# Patient Record
Sex: Male | Born: 1985 | Race: White | Hispanic: No | Marital: Married | Smoking: Current every day smoker
Health system: Southern US, Community
[De-identification: ages and names within clinical notes are randomized; demographics above are authoritative.]

## PROBLEM LIST (undated history)

## (undated) DIAGNOSIS — F419 Anxiety disorder, unspecified: Secondary | ICD-10-CM

## (undated) DIAGNOSIS — F909 Attention-deficit hyperactivity disorder, unspecified type: Secondary | ICD-10-CM

## (undated) DIAGNOSIS — I4510 Unspecified right bundle-branch block: Secondary | ICD-10-CM

## (undated) DIAGNOSIS — F32A Depression, unspecified: Secondary | ICD-10-CM

## (undated) HISTORY — PX: APPENDECTOMY: SHX54

---

## 2013-10-10 ENCOUNTER — Emergency Department (HOSPITAL_COMMUNITY)
Admission: EM | Admit: 2013-10-10 | Discharge: 2013-10-10 | Disposition: A | Discharge status: Home / Self Care | Payer: 59 | Source: Home / Self Care | Attending: Emergency Medicine | Admitting: Emergency Medicine

## 2013-10-10 ENCOUNTER — Encounter (HOSPITAL_COMMUNITY): Payer: Self-pay | Admitting: Emergency Medicine

## 2013-10-10 DIAGNOSIS — J069 Acute upper respiratory infection, unspecified: Secondary | ICD-10-CM

## 2013-10-10 HISTORY — DX: Unspecified right bundle-branch block: I45.10

## 2013-10-10 LAB — POCT URINALYSIS DIP (DEVICE)
Bilirubin Urine: NEGATIVE
GLUCOSE, UA: NEGATIVE mg/dL
Hgb urine dipstick: NEGATIVE
Ketones, ur: NEGATIVE mg/dL
Leukocytes, UA: NEGATIVE
NITRITE: NEGATIVE
Protein, ur: NEGATIVE mg/dL
Specific Gravity, Urine: 1.015 (ref 1.005–1.030)
UROBILINOGEN UA: 0.2 mg/dL (ref 0.0–1.0)
pH: 7 (ref 5.0–8.0)

## 2013-10-10 NOTE — Discharge Instructions (Signed)
Upper Respiratory Infection, Adult An upper respiratory infection (URI) is also sometimes known as the common cold. The upper respiratory tract includes the nose, sinuses, throat, trachea, and bronchi. Bronchi are the airways leading to the lungs. Most people improve within 1 week, but symptoms can last up to 2 weeks. A residual cough may last even longer.  CAUSES Many different viruses can infect the tissues lining the upper respiratory tract. The tissues become irritated and inflamed and often become very moist. Mucus production is also common. A cold is contagious. You can easily spread the virus to others by oral contact. This includes kissing, sharing a glass, coughing, or sneezing. Touching your mouth or nose and then touching a surface, which is then touched by another person, can also spread the virus. SYMPTOMS  Symptoms typically develop 1 to 3 days after you come in contact with a cold virus. Symptoms vary from person to person. They may include:  Runny nose.  Sneezing.  Nasal congestion.  Sinus irritation.  Sore throat.  Loss of voice (laryngitis).  Cough.  Fatigue.  Muscle aches.  Loss of appetite.  Headache.  Low-grade fever. DIAGNOSIS  You might diagnose your own cold based on familiar symptoms, since most people get a cold 2 to 3 times a year. Your caregiver can confirm this based on your exam. Most importantly, your caregiver can check that your symptoms are not due to another disease such as strep throat, sinusitis, pneumonia, asthma, or epiglottitis. Blood tests, throat tests, and X-rays are not necessary to diagnose a common cold, but they may sometimes be helpful in excluding other more serious diseases. Your caregiver will decide if any further tests are required. RISKS AND COMPLICATIONS  You may be at risk for a more severe case of the common cold if you smoke cigarettes, have chronic heart disease (such as heart failure) or lung disease (such as asthma), or if  you have a weakened immune system. The very young and very old are also at risk for more serious infections. Bacterial sinusitis, middle ear infections, and bacterial pneumonia can complicate the common cold. The common cold can worsen asthma and chronic obstructive pulmonary disease (COPD). Sometimes, these complications can require emergency medical care and may be life-threatening. PREVENTION  The best way to protect against getting a cold is to practice good hygiene. Avoid oral or hand contact with people with cold symptoms. Wash your hands often if contact occurs. There is no clear evidence that vitamin C, vitamin E, echinacea, or exercise reduces the chance of developing a cold. However, it is always recommended to get plenty of rest and practice good nutrition. TREATMENT  Treatment is directed at relieving symptoms. There is no cure. Antibiotics are not effective, because the infection is caused by a virus, not by bacteria. Treatment may include:  Increased fluid intake. Sports drinks offer valuable electrolytes, sugars, and fluids.  Breathing heated mist or steam (vaporizer or shower).  Eating chicken soup or other clear broths, and maintaining good nutrition.  Getting plenty of rest.  Using gargles or lozenges for comfort.  Controlling fevers with ibuprofen or acetaminophen as directed by your caregiver.  Increasing usage of your inhaler if you have asthma. Zinc gel and zinc lozenges, taken in the first 24 hours of the common cold, can shorten the duration and lessen the severity of symptoms. Pain medicines may help with fever, muscle aches, and throat pain. A variety of non-prescription medicines are available to treat congestion and runny nose. Your caregiver   can make recommendations and may suggest nasal or lung inhalers for other symptoms.  HOME CARE INSTRUCTIONS   Only take over-the-counter or prescription medicines for pain, discomfort, or fever as directed by your  caregiver.  Use a warm mist humidifier or inhale steam from a shower to increase air moisture. This may keep secretions moist and make it easier to breathe.  Drink enough water and fluids to keep your urine clear or pale yellow.  Rest as needed.  Return to work when your temperature has returned to normal or as your caregiver advises. You may need to stay home longer to avoid infecting others. You can also use a face mask and careful hand washing to prevent spread of the virus. SEEK MEDICAL CARE IF:   After the first few days, you feel you are getting worse rather than better.  You need your caregiver's advice about medicines to control symptoms.  You develop chills, worsening shortness of breath, or brown or red sputum. These may be signs of pneumonia.  You develop yellow or brown nasal discharge or pain in the face, especially when you bend forward. These may be signs of sinusitis.  You develop a fever, swollen neck glands, pain with swallowing, or white areas in the back of your throat. These may be signs of strep throat. SEEK IMMEDIATE MEDICAL CARE IF:   You have a fever.  You develop severe or persistent headache, ear pain, sinus pain, or chest pain.  You develop wheezing, a prolonged cough, cough up blood, or have a change in your usual mucus (if you have chronic lung disease).  You develop sore muscles or a stiff neck. Document Released: 10/01/2000 Document Revised: 06/30/2011 Document Reviewed: 08/09/2010 [employer] Patient Information 2015 [employer]. This information is not intended to replace advice given to you by your health care provider. Make sure you discuss any questions you have with your health care provider.  

## 2013-10-10 NOTE — ED Notes (Signed)
C/o sick since last PM. He and wife are expecting first child in ~1 week

## 2013-10-10 NOTE — ED Provider Notes (Signed)
CSN: [id number]     Arrival date & time 10/10/13  1717 History   First MD Initiated Contact with Patient 10/10/13 1852     Chief Complaint  Patient presents with  . URI   (Consider location/radiation/quality/duration/timing/severity/associated sxs/prior Treatment) HPI Comments: 28 year old male presents complaining of postnasal drainage, sore throat, and body aches. This began last night. Symptoms have been constant. He has not tried taking any over-the-counter medications. He says that he would not normally come to the doctor for something like this but his wife is about to give birth to their first child so he wanted to check to see if this happened to be a bacterial infection so that he can go ahead and be treated. He denies sinus pain or pressure, cough, shortness of breath, fever. No recent travel or sick contacts.   Past Medical History  Diagnosis Date  . RBBB   . Coronary artery disease    Past Surgical History  Procedure Laterality Date  . Appendectomy     History reviewed. No pertinent family history. History  Substance Use Topics  . Smoking status: Never Smoker   . Smokeless tobacco: Not on file  . Alcohol Use: Yes    Review of Systems  Constitutional: Negative for fever and chills.  HENT: Positive for congestion, postnasal drip and sore throat.   Respiratory: Negative for cough and shortness of breath.   Cardiovascular: Negative for chest pain.  Musculoskeletal: Positive for myalgias.  All other systems reviewed and are negative.   Allergies  Review of patient's allergies indicates no known allergies.  Home Medications   Prior to Admission medications   Not on File   BP 129/90  Pulse 69  Temp(Src) 98 F (36.7 C) (Oral)  Resp 14  SpO2 98% Physical Exam  Nursing note and vitals reviewed. Constitutional: He is oriented to person, place, and time. He appears well-developed and well-nourished. No distress.  HENT:  Head: Normocephalic and atraumatic.   Right Ear: External ear normal.  Left Ear: External ear normal.  Nose: Nose normal.  Mouth/Throat: Uvula is midline and mucous membranes are normal. Posterior oropharyngeal erythema (mild) present. No oropharyngeal exudate.  Eyes: Conjunctivae are normal.  Neck: Normal range of motion. Neck supple.  Cardiovascular: Normal rate, regular rhythm and normal heart sounds.  Exam reveals no gallop and no friction rub.   No murmur heard. Pulmonary/Chest: Effort normal and breath sounds normal. No respiratory distress. He has no wheezes. He has no rales.  Abdominal: He exhibits no mass. There is no tenderness. There is no rebound and no guarding.  Lymphadenopathy:    He has no cervical adenopathy.  Neurological: He is alert and oriented to person, place, and time. Coordination normal.  Skin: Skin is warm and dry. No rash noted. He is not diaphoretic.  Psychiatric: He has a normal mood and affect. Judgment normal.    ED Course  Procedures (including critical care time) Labs Review Labs Reviewed  CULTURE, GROUP A STREP  POCT URINALYSIS DIP (DEVICE)    Imaging Review No results found.   MDM   1. URI (upper respiratory infection)    Most likely a viral infection. Symptomatic treatment with over-the-counter medications, followup in a few days if no improvement or worsening     Graylon GoodZachary H Baker, PA-C 10/11/13 1747

## 2013-10-12 LAB — CULTURE, GROUP A STREP

## 2013-10-12 NOTE — ED Provider Notes (Signed)
Medical screening examination/treatment/procedure(s) were performed by non-physician practitioner and as supervising physician I was immediately available for consultation/collaboration.  Leslee Homeavid Keller, M.D.  Reuben Likesavid C Keller, MD 10/12/13 [phone]

## 2014-04-24 ENCOUNTER — Ambulatory Visit (INDEPENDENT_AMBULATORY_CARE_PROVIDER_SITE_OTHER): Payer: 59 | Admitting: Physician Assistant

## 2014-04-24 VITALS — BP 118/72 | HR 84 | Temp 97.9°F | Resp 18 | Ht 68.5 in | Wt 159.0 lb

## 2014-04-24 DIAGNOSIS — J069 Acute upper respiratory infection, unspecified: Secondary | ICD-10-CM

## 2014-04-24 DIAGNOSIS — B9789 Other viral agents as the cause of diseases classified elsewhere: Principal | ICD-10-CM

## 2014-04-24 MED ORDER — LORATADINE-PSEUDOEPHEDRINE ER 5-120 MG PO TB12: 1.0000 | ORAL_TABLET | ORAL | Status: AC

## 2014-04-24 MED ORDER — IBUPROFEN 600 MG PO TABS: 600.0000 mg | ORAL_TABLET | Freq: Three times a day (TID) | ORAL | Status: AC | PRN

## 2014-04-24 MED ORDER — OXYMETAZOLINE HCL 0.05 % NA SOLN: 1.0000 | Freq: Two times a day (BID) | NASAL | Status: DC

## 2014-04-24 NOTE — Progress Notes (Signed)
    IDENTIFYING INFORMATION  Ivan Ortiz / DOB: June 10, 1985 / MRN: [id number]  The patient  does not have a problem list on file.  SUBJECTIVE  CC: Nasal Congestion; Cough; Sinusitis; and Generalized Body Aches   HPI: Ivan Ortiz is a 29 y.o. y.o. male presenting with sinus pressure that started on 04/20/2014.  He reports that he developed global sinus congestion that was severe on Saturday along with mild ear pain that comes and goes, malaise, fatigue and some muscle aches.  As of today he has developed a non productive cough and a mild sore throat.  He denies any eye symptoms.  He has a small child at home and was concerned that his symptoms could represent the flu.  He has had the flu vaccination and denies severe cough and fever. He has not tried anything for his symptoms.     He  has a past medical history of RBBB.    He take no medication.   Ivan Ortiz has No Known Allergies. He  reports that he has never smoked. He does not have any smokeless tobacco history on file. He reports that he drinks alcohol. He  reports that he currently engages in sexual activity.  The patient  has past surgical history that includes Appendectomy.  His family history is not on file.  Review of Systems  Constitutional: Positive for malaise/fatigue. Negative for fever, chills and diaphoresis.  HENT: Positive for congestion and sore throat.   Respiratory: Positive for cough.   Cardiovascular: Negative for chest pain and palpitations.  Skin: Negative.   Neurological: Negative for headaches.    OBJECTIVE  Blood pressure 118/72, pulse 84, temperature 97.9 F (36.6 C), temperature source Oral, resp. rate 18, height 5' 8.5" (1.74 m), weight 159 lb (72.122 kg), SpO2 98 %. The patient's body mass index is 23.82 kg/(m^2).  Physical Exam  Vitals reviewed. Constitutional: He is oriented to person, place, and time. He appears well-developed and well-nourished.  HENT:  Right Ear: External ear normal.  Left  Ear: External ear normal.  Nose: Nose normal.  Mouth/Throat: Oropharynx is clear and moist. No oropharyngeal exudate.  Eyes: Conjunctivae are normal. Right eye exhibits no discharge. Left eye exhibits no discharge.  Cardiovascular: Normal rate, regular rhythm, normal heart sounds and intact distal pulses.   No murmur heard. Respiratory: Effort normal and breath sounds normal.  Musculoskeletal: Normal range of motion.  Neurological: He is alert and oriented to person, place, and time.  Skin: Skin is warm and dry.  Psychiatric: He has a normal mood and affect. His behavior is normal. Judgment and thought content normal.    No results found for this or any previous visit (from the past 24 hour(s)).  ASSESSMENT & PLAN  Ivan Ortiz was seen today for nasal congestion, cough, sinusitis and generalized body aches.  Diagnoses and associated orders for this visit:  Viral URI with cough - loratadine-pseudoephedrine (CLARITIN-D 12 HOUR) 5-120 MG per tablet; Take 1 tablet by mouth every morning. - oxymetazoline (AFRIN NASAL SPRAY) 0.05 % nasal spray; Place 1 spray into both nostrils 2 (two) times daily. - ibuprofen (ADVIL,MOTRIN) 600 MG tablet; Take 1 tablet (600 mg total) by mouth every 8 (eight) hours as needed.     The patient was instructed to to call or comeback to clinic as needed, or should symptoms warrant.  Deliah Boston, MHS, PA-C [facility] 04/24/2014 3:18 PM

## 2015-10-26 ENCOUNTER — Encounter: Payer: Self-pay | Admitting: Physician Assistant

## 2015-10-26 ENCOUNTER — Ambulatory Visit (INDEPENDENT_AMBULATORY_CARE_PROVIDER_SITE_OTHER): Payer: 59 | Admitting: Physician Assistant

## 2015-10-26 ENCOUNTER — Ambulatory Visit (INDEPENDENT_AMBULATORY_CARE_PROVIDER_SITE_OTHER): Payer: 59

## 2015-10-26 VITALS — BP 128/80 | HR 78 | Temp 98.7°F | Resp 16 | Ht 67.5 in | Wt 156.8 lb

## 2015-10-26 DIAGNOSIS — R198 Other specified symptoms and signs involving the digestive system and abdomen: Secondary | ICD-10-CM

## 2015-10-26 DIAGNOSIS — R053 Chronic cough: Secondary | ICD-10-CM

## 2015-10-26 DIAGNOSIS — R05 Cough: Secondary | ICD-10-CM | POA: Diagnosis not present

## 2015-10-26 DIAGNOSIS — F458 Other somatoform disorders: Secondary | ICD-10-CM

## 2015-10-26 DIAGNOSIS — R0989 Other specified symptoms and signs involving the circulatory and respiratory systems: Secondary | ICD-10-CM

## 2015-10-26 DIAGNOSIS — F17210 Nicotine dependence, cigarettes, uncomplicated: Secondary | ICD-10-CM | POA: Diagnosis not present

## 2015-10-26 LAB — POCT CBC
GRANULOCYTE PERCENT: 64.7 % (ref 37–80)
HEMATOCRIT: 49.3 % (ref 43.5–53.7)
Hemoglobin: 17.4 g/dL (ref 14.1–18.1)
Lymph, poc: 1.5 (ref 0.6–3.4)
MCH, POC: 31.9 pg — AB (ref 27–31.2)
MCHC: 35.4 g/dL (ref 31.8–35.4)
MCV: 90.1 fL (ref 80–97)
MID (cbc): 0.5 (ref 0–0.9)
MPV: 7.4 fL (ref 0–99.8)
POC GRANULOCYTE: 3.7 (ref 2–6.9)
POC LYMPH %: 26.4 % (ref 10–50)
POC MID %: 8.9 %M (ref 0–12)
Platelet Count, POC: 183 10*3/uL (ref 142–424)
RBC: 5.46 M/uL (ref 4.69–6.13)
RDW, POC: 12.9 %
WBC: 5.7 10*3/uL (ref 4.6–10.2)

## 2015-10-26 MED ORDER — VARENICLINE TARTRATE 1 MG PO TABS: 1.0000 mg | ORAL_TABLET | Freq: Two times a day (BID) | ORAL | Status: DC

## 2015-10-26 MED ORDER — VARENICLINE TARTRATE 0.5 MG X 11 & 1 MG X 42 PO MISC: ORAL | Status: DC

## 2015-10-26 NOTE — Patient Instructions (Signed)
     IF you received an x-ray today, you will receive an invoice from [facility]. Please contact [facility] at [phone] with questions or concerns regarding your invoice.   IF you received labwork today, you will receive an invoice from [facility]. Please contact [facility] at [phone] with questions or concerns regarding your invoice.   Our billing staff will not be able to assist you with questions regarding bills from these companies.  You will be contacted with the lab results as soon as they are available. The fastest way to get your results is to activate your My Chart account. Instructions are located on the last page of this paperwork. If you have not heard from us regarding the results in 2 weeks, please contact this office.      

## 2015-10-26 NOTE — Progress Notes (Signed)
10/26/2015 10:26 AM   DOB: 1985-10-21 / MRN: [id number]  SUBJECTIVE:  Ivan Ortiz is a 30 y.o. male presenting chrornic cough.  Ivan Ortiz has a 15 pack year history of smoking and reports Ivan Ortiz has a hacking cough that has been worsening.  The cough is worse in the morning.  Ivan Ortiz also complains of "throat swelling" and viscous drainage down his throat and worries about his smoking history with regard to this complaint. Ivan Ortiz has two small children at home and a third on the way.  Reports Ivan Ortiz worries about his smoking reducing his mortality and does not want to leave his wife and kids.    Dep[facility] screen [facility] 10/26/2015 10/26/2015  Decreased Interest 0 0  Down, Depressed, Hopeless 0 0  PHQ - 2 Score 0 0   Ivan Ortiz has No Known Allergies.   Ivan Ortiz  has a past medical history of RBBB.    Ivan Ortiz  reports that Ivan Ortiz has never smoked. Ivan Ortiz does not have any smokeless tobacco history on file. Ivan Ortiz reports that Ivan Ortiz drinks alcohol. Ivan Ortiz  reports that Ivan Ortiz currently engages in sexual activity. The patient  has past surgical history that includes Appendectomy.  His family history is not on file.  Review of Systems  Constitutional: Negative for fever, chills, weight loss and diaphoresis.  Musculoskeletal: Negative for myalgias.  Skin: Negative for rash.  Neurological: Negative for dizziness and headaches.    Problem list and medications reviewed and updated by myself where necessary, and exist elsewhere in the encounter.   OBJECTIVE:  BP 128/80 mmHg  Pulse 78  Temp(Src) 98.7 F (37.1 C) (Oral)  Resp 16  Ht 5' 7.5" (1.715 m)  Wt 156 lb 12.8 oz (71.124 kg)  BMI 24.18 kg/m2  SpO2 100%  Physical Exam  Constitutional: Ivan Ortiz is oriented to person, place, and time. Ivan Ortiz appears well-developed. Ivan Ortiz does not appear ill.  HENT:  Mouth/Throat: Uvula is midline, oropharynx is clear and moist and mucous membranes are normal.  Eyes: Conjunctivae and EOM are normal. Pupils are equal, round, and reactive to light.  Cardiovascular: Normal  rate and regular rhythm.   Pulmonary/Chest: Effort normal and breath sounds normal. Ivan Ortiz has no decreased breath sounds. Ivan Ortiz has no wheezes. Ivan Ortiz has no rhonchi. Ivan Ortiz has no rales.  Abdominal: Ivan Ortiz exhibits no distension.  Musculoskeletal: Normal range of motion.  Neurological: Ivan Ortiz is alert and oriented to person, place, and time. No cranial nerve deficit. Coordination normal.  Skin: Skin is warm and dry. Ivan Ortiz is not diaphoretic.  Psychiatric: Ivan Ortiz has a normal mood and affect.  Nursing note and vitals reviewed.   Dg Neck Soft Tissue  10/26/2015  CLINICAL DATA:  Globus sensation. EXAM: NECK SOFT TISSUES - 1+ VIEW FINDINGS: Soft tissues of the neck are unremarkable. Epiglottis appears normal. Cervical airway widely patent. No foreign body noted. No acute bony abnormality. IMPRESSION: Negative exam. Electronically Signed   By: Maisie Fushomas  Register   On: 10/26/2015 10:11   Dg Chest 2 View  10/26/2015  CLINICAL DATA:  30 year old male with 15 year smoking history. Globus sensation. EXAM: CHEST  2 VIEW COMPARISON:  Same date neck radiograph FINDINGS: Cardiomediastinal contours are normal. No pleural effusion or pneumothorax. The lungs are well inflated without focal airspace consolidation or pulmonary edema. IMPRESSION: No active cardiopulmonary disease. No findings on this chest radiograph to explain the patient's reported globus sensation. Electronically Signed   By: Deatra RobinsonKevin  Herman M.D.   On: 10/26/2015 10:19    ASSESSMENT AND PLAN  Ivan BootyJoshua was seen today for annual exam.  Diagnoses and all orders for this visit:  Globus sensation: I don't have an explanation for this symptom however I have advised that Ivan Ortiz quit smoking.  Ivan Ortiz has failed patches and gum in the past so I will prescribe chantix.  If the symptoms are present 1 month after Ivan Ortiz quits then will try ranitidine and zyrtec.   -     DG Neck Soft Tissue; Future  Chronic coughing -     DG Chest 2 View; Future -     POCT CBC  Smokes more than 1 pack a day with  greater than 15 pack year history -     varenicline (CHANTIX) 1 MG tablet; Take 1 tablet (1 mg total) by mouth 2 (two) times daily. Starting 11/26/15. -     varenicline (CHANTIX PAK) 0.5 MG X 11 & 1 MG X 42 tablet; Take one 0.5 mg tablet by mouth once daily for 3 days, then increase to one 0.5 mg tablet twice daily for 4 days, then increase to one 1 mg tablet twice daily.    The patient was advised to call or return to clinic if Ivan Ortiz does not see an improvement in symptoms, or to seek the care of the closest emergency department if Ivan Ortiz worsens with the above plan.   Deliah BostonMichael Reshaun Briseno, MHS, PA-C [facility] 10/26/2015 10:26 AM

## 2015-11-07 ENCOUNTER — Telehealth: Payer: Self-pay

## 2015-11-07 ENCOUNTER — Other Ambulatory Visit: Payer: Self-pay | Admitting: Physician Assistant

## 2015-11-07 MED ORDER — BUPROPION HCL ER (SR) 150 MG PO TB12: ORAL_TABLET | ORAL | Status: DC

## 2015-11-07 NOTE — Progress Notes (Signed)
Chantix not covered.  Starting Wellbutrin. Deliah BostonMichael Clark, MS, PA-C 12:44 PM, 11/07/2015

## 2015-11-07 NOTE — Telephone Encounter (Signed)
PA for Chantix was denied because pt has not tried/failed wellbutrin yet. Casimiro NeedleMichael, do you want to Rx wellbutrin for smoking cessation?

## 2015-11-07 NOTE — Telephone Encounter (Signed)
Called pt who was not interested (at first) in trying an anti-depressant (he was familiar with wellbutrin). I advised that it is also approved for smoking cessation. Pt will consider taking it and I advised 2 add'l RFs were sent in. Advised that if this does not help after reaching therapeutic level, we can try to do the PA for Chantix again and it may be approved at that point. Pt will call me if that happens.

## 2015-11-07 NOTE — Telephone Encounter (Signed)
I will send in for Wellbutrin.  Please call him back to let him know. Deliah BostonMichael Joya Willmott, MS, PA-C 12:41 PM, 11/07/2015

## 2016-02-13 ENCOUNTER — Emergency Department (HOSPITAL_COMMUNITY)
Admission: EM | Admit: 2016-02-13 | Discharge: 2016-02-13 | Disposition: A | Discharge status: Home / Self Care | Payer: 59 | Attending: Emergency Medicine | Admitting: Emergency Medicine

## 2016-02-13 ENCOUNTER — Encounter (HOSPITAL_COMMUNITY): Payer: Self-pay

## 2016-02-13 DIAGNOSIS — R200 Anesthesia of skin: Secondary | ICD-10-CM | POA: Diagnosis present

## 2016-02-13 DIAGNOSIS — F17200 Nicotine dependence, unspecified, uncomplicated: Secondary | ICD-10-CM | POA: Diagnosis not present

## 2016-02-13 DIAGNOSIS — R202 Paresthesia of skin: Secondary | ICD-10-CM | POA: Diagnosis not present

## 2016-02-13 NOTE — ED Provider Notes (Signed)
[facility]-EMERGENCY DEPT Provider Note   CSN: [id number] Arrival date & time: 02/13/16  1140     History   Chief Complaint Chief Complaint  Patient presents with  . Numbness    HPI Ivan Ortiz is a 30 y.o. male.  HPI Patient presents with numbness in his right face. States he woke this morning. States it felt as if his right ear canal was not also gone. No numbness weakness. States it may go down to the shoulder little bit. No drooling. No vision changes. States that he looked in the mirror for and thinks that his left pupil is larger than the right. States that someone looked in his eyes at work with a light.the reaction was not normal. No trauma. No episodes like this in the past.   Past Medical History:  Diagnosis Date  . RBBB     There are no active problems to display for this patient.   Past Surgical History:  Procedure Laterality Date  . APPENDECTOMY         Home Medications    Prior to Admission medications   Medication Sig Start Date End Date Taking? Authorizing Provider  buPROPion (WELLBUTRIN SR) 150 MG 12 hr tablet Take one tab daily for 3 days, then increase to one tab in the morning and one tab at night. Patient not taking: Reported on 02/13/2016 11/07/15   Ofilia Neas, PA-C    Family History History reviewed. No pertinent family history.  Social History Social History  Substance Use Topics  . Smoking status: Current Every Day Smoker    Packs/day: 0.50  . Smokeless tobacco: Never Used  . Alcohol use Yes     Comment: occ     Allergies   Review of patient's allergies indicates no known allergies.   Review of Systems Review of Systems  Constitutional: Negative for appetite change.  Eyes: Negative for pain.  Respiratory: Negative for chest tightness.   Cardiovascular: Negative for chest pain.  Gastrointestinal: Negative for abdominal pain.  Genitourinary: Negative for dysuria.  Musculoskeletal: Negative for back pain.  Neurological:  Positive for numbness. Negative for facial asymmetry and headaches.  Psychiatric/Behavioral: Negative for confusion.     Physical Exam Updated Vital Signs BP 131/89   Pulse 80   Temp 98.2 F (36.8 C) (Oral)   Resp 16   Ht 5\' 8"  (1.727 m)   Wt 155 lb (70.3 kg)   SpO2 99%   BMI 23.57 kg/m   Physical Exam  Constitutional: He is oriented to person, place, and time. He appears well-developed.  HENT:  Head: Atraumatic.  Eyes: EOM are normal.  Neck: Neck supple.  Cardiovascular: Normal rate.   Pulmonary/Chest: Effort normal.  Abdominal: Soft.  Musculoskeletal: Normal range of motion.  Neurological: He is alert and oriented to person, place, and time.  Paresthesia/decreased sensation to right side of face. Eye movements intact. Equal smile. Good eyebrow raise and lid closing bilaterally. Sensation intact grossly over shoulders. Good grips bilaterally. Normal gait. Pupils reactive and equal bilaterally.  Skin: Skin is warm. Capillary refill takes less than 2 seconds.     ED Treatments / Results  Labs (all labs ordered are listed, but only abnormal results are displayed) Labs Reviewed - No data to display  EKG  EKG Interpretation None       Radiology No results found.  Procedures Procedures (including critical care time)  Medications Ordered in ED Medications - No data to display   Initial Impression / Assessment and  Plan / ED Course  I have reviewed the triage vital signs and the nursing notes.  Pertinent labs & imaging results that were available during my care of the patient were reviewed by me and considered in my medical decision making (see chart for details).  Clinical Course    Patient with right-sided facial numbness. No pupil abnormality seen now. Discussed with patient about CT scan versus observation. Patient will follow-up with neurology as needed. CT scan likely low yield at this time. No other neuro deficits. Considered the fact that could be an  early shingles. Discharge home.  Final Clinical Impressions(s) / ED Diagnoses   Final diagnoses:  Paresthesia    New Prescriptions Discharge Medication List as of 02/13/2016 12:44 PM       Benjiman CoreNathan Kennedi Lizardo, MD 02/13/16 1301

## 2016-02-13 NOTE — ED Triage Notes (Signed)
Pt reports he woke up this morning with right side facial numbness. Pt reports the numbness has traveled into his face and into his right arm. Pt also reports he noticed his pupils were different sizes this morning.

## 2016-05-21 ENCOUNTER — Ambulatory Visit (INDEPENDENT_AMBULATORY_CARE_PROVIDER_SITE_OTHER): Payer: Self-pay | Admitting: Nurse Practitioner

## 2016-05-21 VITALS — BP 104/62 | HR 68 | Temp 98.1°F | Wt 160.8 lb

## 2016-05-21 DIAGNOSIS — R5381 Other malaise: Secondary | ICD-10-CM

## 2016-05-21 DIAGNOSIS — R52 Pain, unspecified: Secondary | ICD-10-CM

## 2016-05-21 DIAGNOSIS — R5383 Other fatigue: Secondary | ICD-10-CM

## 2016-05-21 DIAGNOSIS — J069 Acute upper respiratory infection, unspecified: Secondary | ICD-10-CM

## 2016-05-21 LAB — POCT INFLUENZA A/B
Influenza A, POC: NEGATIVE
Influenza B, POC: NEGATIVE

## 2016-05-21 NOTE — Progress Notes (Addendum)
Subjective:     Ivan Ortiz is a 31 y.o. male who presents for evaluation of symptoms of a URI. Symptoms include achiness, congestion, sneezing and muscle aches.  . Onset of symptoms was today and has been gradually worsening since that time. Treatment to date: none.  Patient is concerned that he may have influenza due to a recent patient contact in his work setting.  The following portions of the patient's history were reviewed and updated as appropriate: allergies, current medications and past medical history.  Review of Systems Constitutional: positive for malaise Eyes: negative Ears, nose, mouth, throat, and face: positive for nasal congestion Respiratory: negative Cardiovascular: negative Musculoskeletal:positive for neck, upper back and left shoulder aches.   Objective:    BP 104/62   Pulse 68   Temp 98.1 F (36.7 C)   Wt 160 lb 12.8 oz (72.9 kg)   SpO2 98%   BMI 24.45 kg/m  General appearance: alert and cooperative Head: Normocephalic, without obvious abnormality, atraumatic Eyes: conjunctivae/corneas clear. PERRL, EOM's intact. Fundi benign. Ears: normal TM's and external ear canals both ears Nose: clear discharge, mild maxillary sinus tenderness left Throat: lips, mucosa, and tongue normal; teeth and gums normal   Cardiovascular:  regular rate and rhythm, S1, S2 normal, no murmur, click, rub or gallop Respiratory:  Lungs clear to auscultation bilaterally Musculoskeletal:  No deformities, lesions or crepitus.  FROM to back, neck and left shoulder.    Assessment:    viral upper respiratory illness and Malaise   Plan:    Discussed diagnosis and treatment of URI. Suggested symptomatic OTC remedies. Nasal saline spray for congestion. Follow up as needed. Follow up in 2-3 days if no improvement. days or as needed.

## 2016-05-21 NOTE — Patient Instructions (Addendum)
Weakness Weakness is a lack of strength. It may be felt all over the body (generalized) or in one specific part of the body (focal). Some causes of weakness can be serious. You may need further medical evaluation, especially if you are elderly or you have a history of immunosuppression (such as chemotherapy or HIV), kidney disease, heart disease, or diabetes. CAUSES  Weakness can be caused by many different things, including:  Infection.  Physical exhaustion.  Internal bleeding or other blood loss that results in a lack of red blood cells (anemia).  Dehydration. This cause is more common in elderly people.  Side effects or electrolyte abnormalities from medicines, such as pain medicines or sedatives.  Emotional distress, anxiety, or depression.  Circulation problems, especially severe peripheral arterial disease.  Heart disease, such as rapid atrial fibrillation, bradycardia, or heart failure.  Nervous system disorders, such as Guillain-Barr syndrome, multiple sclerosis, or stroke. DIAGNOSIS  To find the cause of your weakness, your caregiver will take your history and perform a physical exam. Lab tests or X-rays may also be ordered, if needed. TREATMENT  Treatment of weakness depends on the cause of your symptoms and can vary greatly.  Use Tylenol or Ibuprofen for general discomfort, fever or pain.   HOME CARE INSTRUCTIONS   Rest as needed.  Eat a well-balanced diet.  Try to get some exercise every day.  Only take over-the-counter or prescription medicines as directed by your caregiver. SEEK MEDICAL CARE IF:   Your weakness seems to be getting worse or spreads to other parts of your body.  You develop new aches or pains. SEEK IMMEDIATE MEDICAL CARE IF:   You cannot perform your normal daily activities, such as getting dressed and feeding yourself.  You cannot walk up and down stairs, or you feel exhausted when you do so.  You have shortness of breath or chest  pain.  You have difficulty moving parts of your body.  You have weakness in only one area of the body or on only one side of the body.  You have a fever.  You have trouble speaking or swallowing.  You cannot control your bladder or bowel movements.  You have black or bloody vomit or stools. MAKE SURE YOU:  Understand these instructions.  Will watch your condition.  Will get help right away if you are not doing well or get worse. This information is not intended to replace advice given to you by your health care provider. Make sure you discuss any questions you have with your health care provider. Document Released: 04/07/2005 Document Revised: 10/07/2011 Document Reviewed: 01/26/2015 Elsevier Interactive Patient Education  2017 [employer]

## 2017-08-10 IMAGING — DX DG CHEST 2V
2 series · 2 of 2 positions shown · non-contrast
Comparison: Same date neck radiograph

CLINICAL DATA: 29-year-old male with 15 year smoking history.
Globus sensation.

EXAM:
CHEST  2 VIEW

[chest pa]
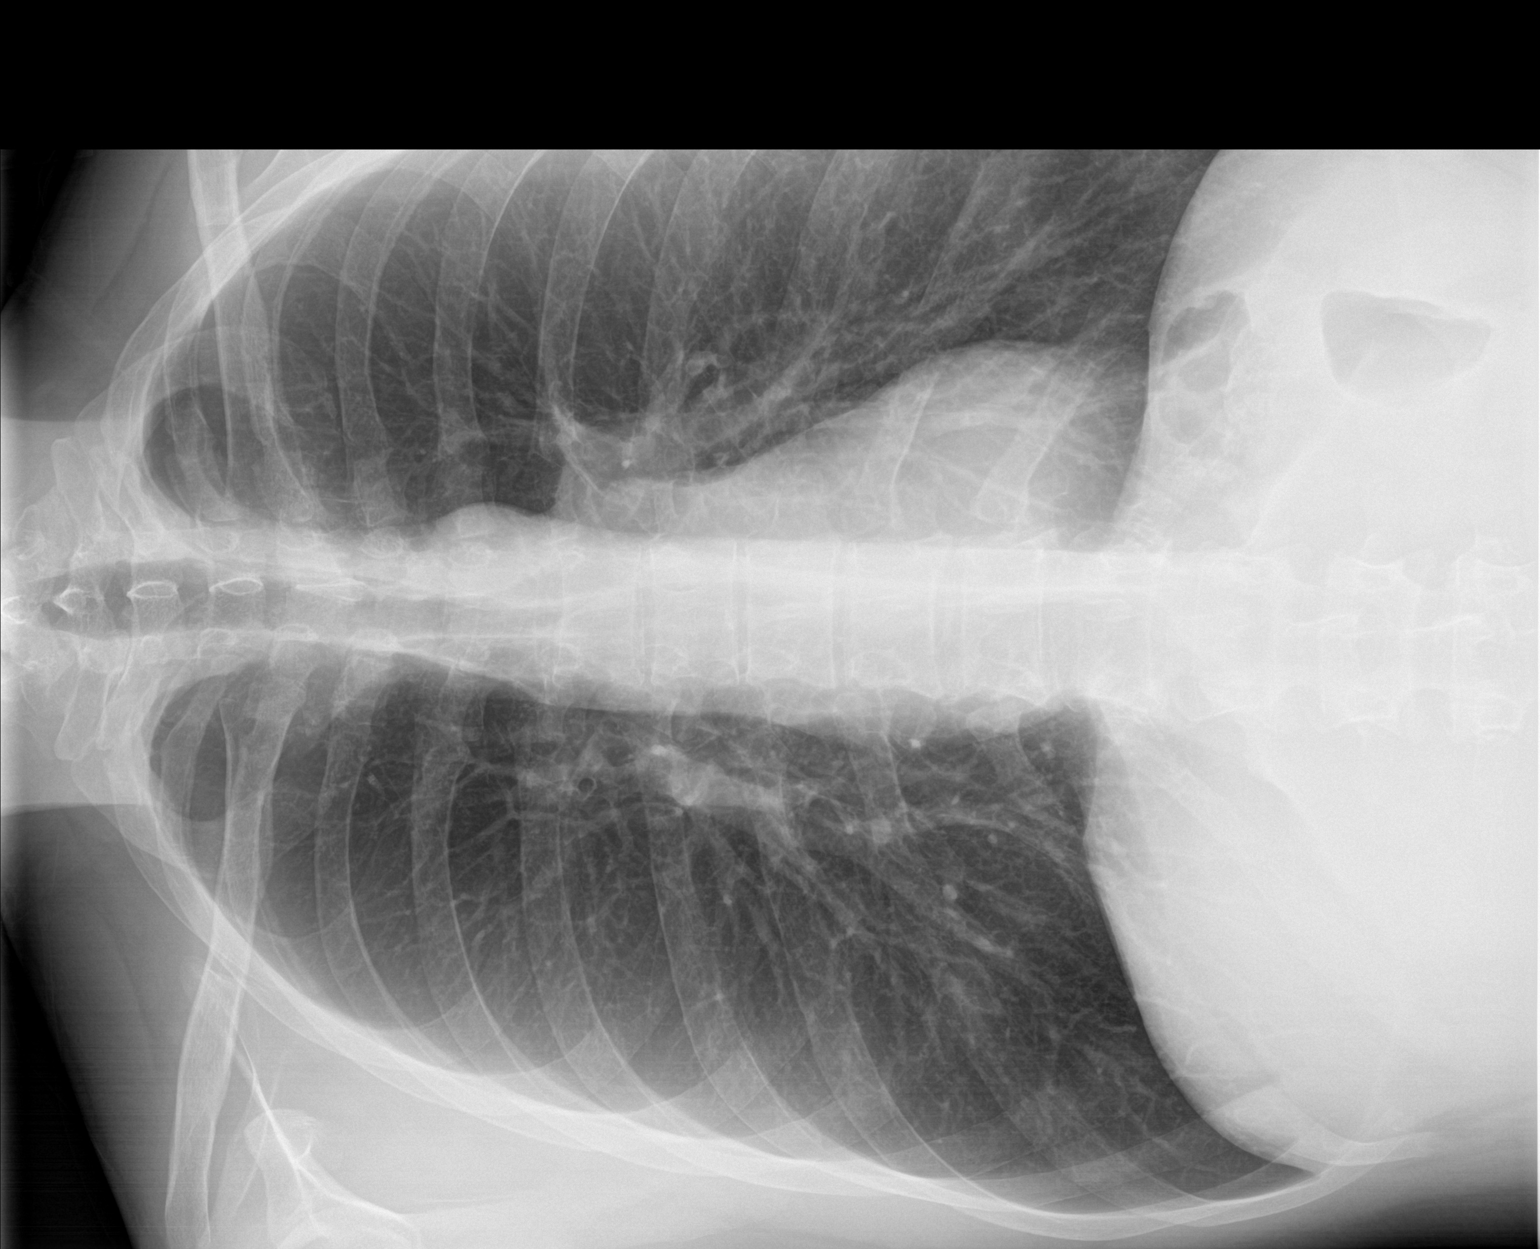

[chest lat]
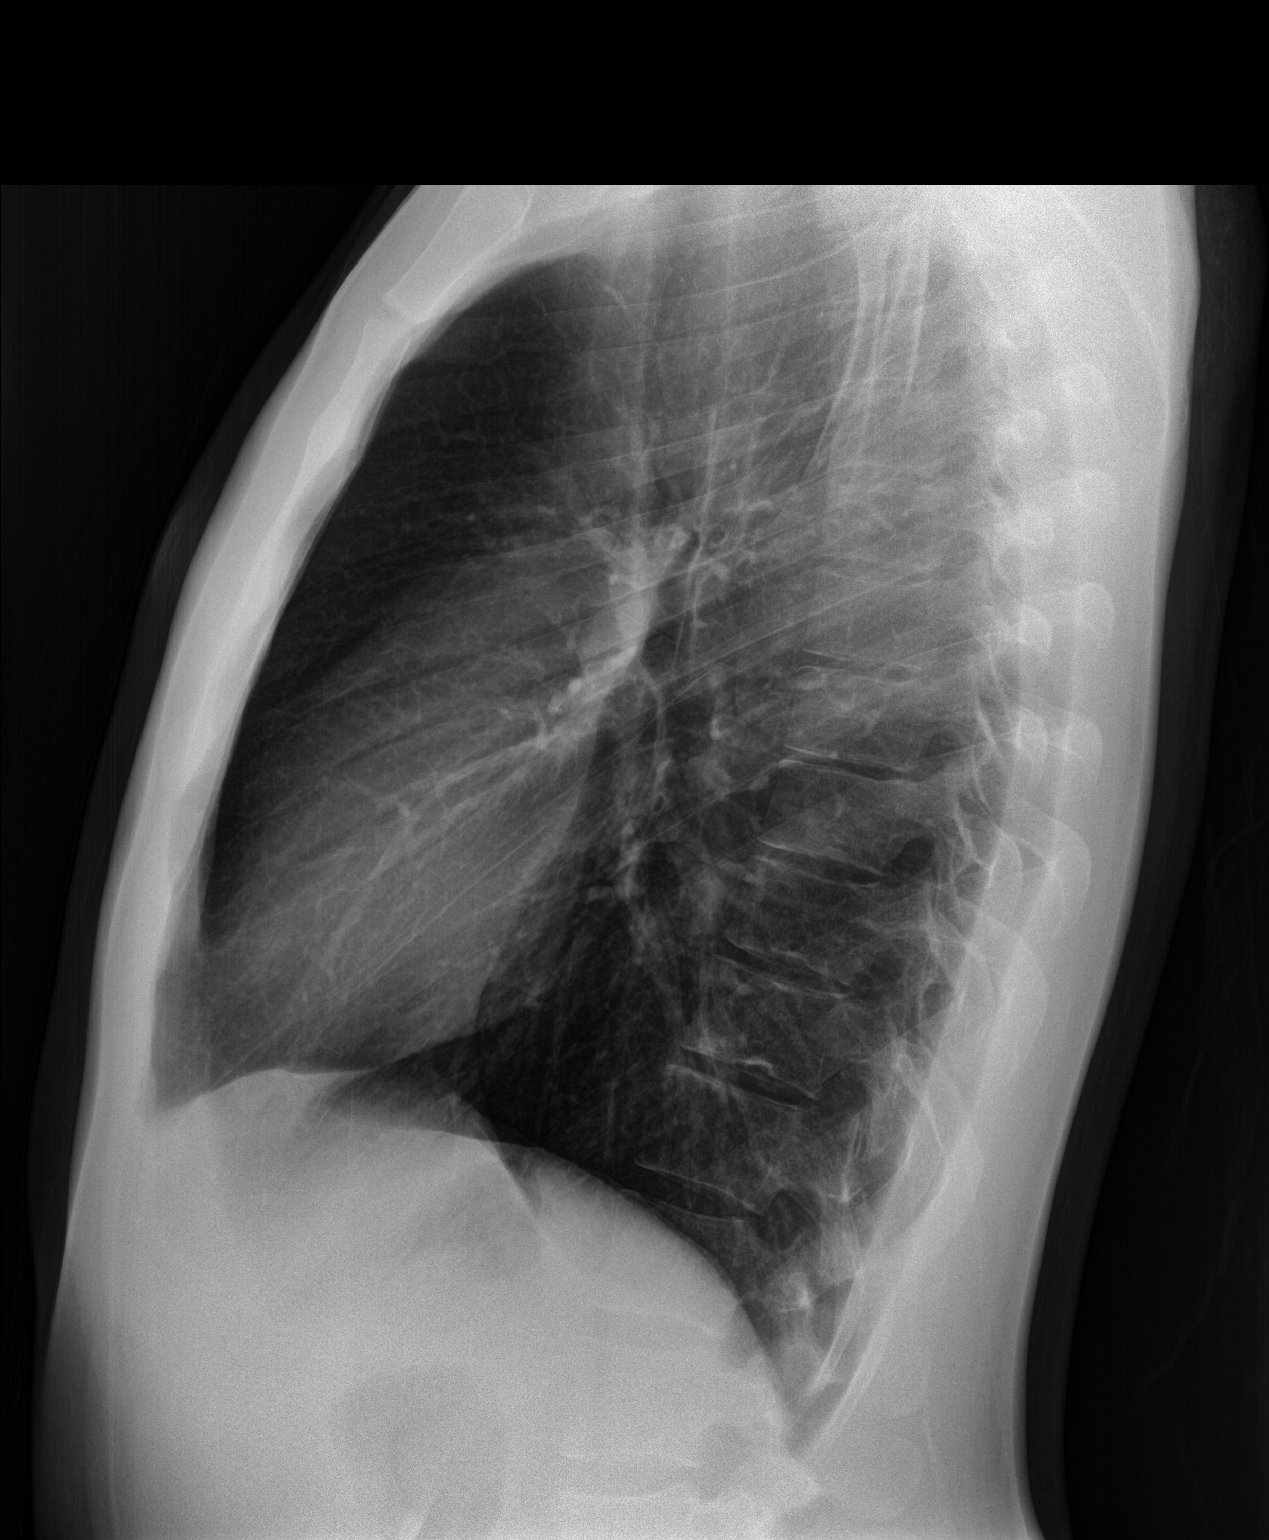

[2 of 2 positions shown; findings below may reference images not displayed]

FINDINGS: Cardiomediastinal contours are normal. No pleural effusion or
pneumothorax. The lungs are well inflated without focal airspace
consolidation or pulmonary edema.
IMPRESSION: No active cardiopulmonary disease. No findings on this chest
radiograph to explain the patient's reported globus sensation.

## 2020-10-17 ENCOUNTER — Ambulatory Visit: Payer: 59 | Admitting: Behavioral Health

## 2020-11-01 ENCOUNTER — Ambulatory Visit (INDEPENDENT_AMBULATORY_CARE_PROVIDER_SITE_OTHER): Payer: 59 | Admitting: Behavioral Health

## 2020-11-01 VITALS — BP 128/76 | HR 73 | Ht 68.0 in | Wt 162.0 lb

## 2020-11-01 DIAGNOSIS — F331 Major depressive disorder, recurrent, moderate: Secondary | ICD-10-CM | POA: Diagnosis not present

## 2020-11-01 DIAGNOSIS — F411 Generalized anxiety disorder: Secondary | ICD-10-CM | POA: Diagnosis not present

## 2020-11-01 MED ORDER — LAMOTRIGINE 25 MG PO TABS: ORAL_TABLET | ORAL | 1 refills | Status: DC

## 2020-11-01 MED ORDER — BUPROPION HCL ER (XL) 150 MG PO TB24: ORAL_TABLET | ORAL | 0 refills | Status: DC

## 2020-11-02 ENCOUNTER — Encounter: Payer: Self-pay | Admitting: Behavioral Health

## 2020-11-02 NOTE — Progress Notes (Addendum)
[facility] MD/PA/NP Initial Note  11/02/2020 12:37 AM Ivan Ortiz  MRN:  [id number]  Chief Complaint:  Chief Complaint   Anxiety; Depression; Establish Care; Medication Problem     HPI:  35 year old male presents to this office for initial visit and to establish care. He says that he has struggled with anxiety and depression for about 10 years. Says he resisted seeking help multiple times. Says about 3 years ago, he started thinking seriously about worsening symptoms and needing help. Says that he was on Lexapro in 2021 for a short period but it gave him severe sexual side effects. He attempted psychotherapy from Sept. 2021 to April 2022. Says that he finally got to the place where he knew the symptoms might eventually effect his job. Says he has problems with motivation and lack of drive. He agrees that he is easily distracted. Feels like his moods fluctuate frequently. He says that he has been irritable more than normal and has directed some of that towards his wife. He says, "I am a 31 year old dad of 4 kids and I am stressed". He reports his anxiety today at 4/10 and depression at 6/10. He says that he sleeps at least 7 hours per night. No mania, no psychosis, no SI/HI.   Past psychiatric medication trials Lexapro Wellbutrin Buspar  Visit Diagnosis:    ICD-10-CM   1. Major depressive disorder, recurrent episode, moderate (HCC)  F33.1 lamoTRIgine (LAMICTAL) 25 MG tablet    buPROPion (WELLBUTRIN XL) 150 MG 24 hr tablet    2. Generalized anxiety disorder  F41.1 lamoTRIgine (LAMICTAL) 25 MG tablet    buPROPion (WELLBUTRIN XL) 150 MG 24 hr tablet      Past Psychiatric History: PCP   Past Medical History:  Past Medical History:  Diagnosis Date   RBBB     Past Surgical History:  Procedure Laterality Date   APPENDECTOMY      Family Psychiatric History: none noted this visit  Family History: History reviewed. No pertinent family history.  Social History:  Social History    Socioeconomic History   Marital status: Married    Spouse name: Brandi   Number of children: 4   Years of education: 16   Highest education level: Bachelor's degree (e.g., BA, AB, BS)  Occupational History   Occupation: Patent examiner  Tobacco Use   Smoking status: Every Day    Packs/day: 0.50    Types: Cigarettes   Smokeless tobacco: Never  Substance and Sexual Activity   Alcohol use: Yes    Comment: occ   Drug use: Not on file   Sexual activity: Yes    Comment: Wife  Other Topics Concern   Not on file  Social History Narrative   Lives with wife and 4 children in [place].   Social Determinants of Health   Financial Resource Strain: Not on file  Food Insecurity: Not on file  Transportation Needs: Not on file  Physical Activity: Not on file  Stress: Not on file  Social Connections: Not on file    Allergies: No Known Allergies  Metabolic Disorder Labs: No results found for: HGBA1C, MPG No results found for: PROLACTIN No results found for: CHOL, TRIG, HDL, CHOLHDL, VLDL, LDLCALC No results found for: TSH  Therapeutic Level Labs: No results found for: LITHIUM No results found for: VALPROATE No components found for:  CBMZ  Current Medications: Current Outpatient Medications  Medication Sig Dispense Refill   buPROPion (WELLBUTRIN XL) 150 MG 24  hr tablet Take one tablet 150 mg for 7 days, then two tablets 300 mg daily. 60 tablet 0   lamoTRIgine (LAMICTAL) 25 MG tablet One tablet for two weeks. Then two tablets 50 mg daily. 60 tablet 1   buPROPion (WELLBUTRIN SR) 150 MG 12 hr tablet Take one tab daily for 3 days, then increase to one tab in the morning and one tab at night. (Patient not taking: No sig reported) 63 tablet 2   No current facility-administered medications for this visit.    Medication Side Effects: none  Orders placed this visit:  No orders of the defined types were placed in this encounter.   Psychiatric Specialty Exam:  Review of  Systems  Constitutional: Negative.   Musculoskeletal:  Positive for neck pain.  Allergic/Immunologic: Negative.   Neurological: Negative.   Psychiatric/Behavioral:  Positive for dysphoric mood. The patient is nervous/anxious.    Blood pressure 128/76, pulse 73, height 5\' 8"  (1.727 m), weight 162 lb (73.5 kg).Body mass index is 24.63 kg/m.  General Appearance: Casual, Neat, and Well Groomed  Eye Contact:  Good  Speech:  Clear and Coherent  Volume:  Normal  Mood:  Anxious and Depressed  Affect:  Appropriate  Thought Process:  Coherent  Orientation:  Full (Time, Place, and Person)  Thought Content: Logical   Suicidal Thoughts:  No  Homicidal Thoughts:  No  Memory:  WNL  Judgement:  Good  Insight:  Good  Psychomotor Activity:  Normal  Concentration:  Concentration: Good  Recall:  Good  Fund of Knowledge: Good  Language: Good  Assets:  Desire for Improvement  ADL's:  Intact  Cognition: WNL  Prognosis:  Good   Screenings:  PHQ2-9    Flowsheet Row Office Visit from 11/01/2020 in [facility] Office Visit from 10/26/2015 in [facility] at [facility] Total Score 6 0  PHQ-9 Total Score 15 --       Receiving Psychotherapy: No   Treatment Plan/Recommendations:  To increase Wellbutrin to 300 mg XL daily. To start Lamictal 25 mg for one week, then increase to 50 mg daily. Will report any side effects or worsening symptoms of depression promptly. To follow up in 4 weeks to reassess.  Greater than 50% of  60 min. face to face time with patient was spent on counseling and coordination of care. We discussed his 10 year history of suffering form anxiety and depression. We talked about pros and cons of different medications and their side effects. Patient is trying to avoid medications that cause sexual SE. Reviewed with pt possible SE of Lamictal and to be aware of rare rash. Educated on SJS.      [facility], NP

## 2020-11-23 ENCOUNTER — Other Ambulatory Visit: Payer: Self-pay | Admitting: Behavioral Health

## 2020-11-23 DIAGNOSIS — F411 Generalized anxiety disorder: Secondary | ICD-10-CM

## 2020-11-23 DIAGNOSIS — F331 Major depressive disorder, recurrent, moderate: Secondary | ICD-10-CM

## 2020-11-29 ENCOUNTER — Encounter: Payer: Self-pay | Admitting: Behavioral Health

## 2020-11-29 ENCOUNTER — Other Ambulatory Visit: Payer: Self-pay

## 2020-11-29 ENCOUNTER — Ambulatory Visit (INDEPENDENT_AMBULATORY_CARE_PROVIDER_SITE_OTHER): Payer: 59 | Admitting: Behavioral Health

## 2020-11-29 DIAGNOSIS — F39 Unspecified mood [affective] disorder: Secondary | ICD-10-CM

## 2020-11-29 DIAGNOSIS — F331 Major depressive disorder, recurrent, moderate: Secondary | ICD-10-CM

## 2020-11-29 DIAGNOSIS — F411 Generalized anxiety disorder: Secondary | ICD-10-CM | POA: Diagnosis not present

## 2020-11-29 MED ORDER — LAMOTRIGINE 100 MG PO TABS: 100.0000 mg | ORAL_TABLET | Freq: Every day | ORAL | 2 refills | Status: DC

## 2020-11-29 MED ORDER — BUPROPION HCL ER (XL) 150 MG PO TB24: ORAL_TABLET | ORAL | 2 refills | Status: DC

## 2020-11-29 NOTE — Progress Notes (Signed)
[facility] Med Check  Patient ID: Ivan Ortiz,  MRN: [id number]  PCP: Eber Jones, NP  Date of Evaluation: 11/29/2020 Time spent:30 minutes  Chief Complaint:  Chief Complaint   Depression; Anxiety; Follow-up; Medication Refill; Medication Problem     HISTORY/CURRENT STATUS: HPI  35 year old male presents to this office for follow up and medication management. His wife is present during interview with his consent. He says that he has improved with anxiety and depression but has noticed increased irritability and spouse also agrees with this statement. He does not like feeling this ways but would like to continue a while longer to see if some of the irritability subsides with time. He says he is also interested in increasing the Lamictal to help with moods. He says that he does have a few beers daily but understands to be cautious with mixing with medication. He reports his anxiety today at 3/10 and depression at 4/10. He does acknowledge increased stressors such as work and having 4 young children at home. He denies mania, no psychosis. No SI/HI.    Past psychiatric medication trials Lexapro Wellbutrin Buspar    Individual Medical History/ Review of Systems: Changes? :No   Allergies: Patient has no known allergies.  Current Medications:  Current Outpatient Medications:    buPROPion (WELLBUTRIN XL) 150 MG 24 hr tablet, TAKE 2 TABS DAILY, Disp: 60 tablet, Rfl: 2   lamoTRIgine (LAMICTAL) 100 MG tablet, Take 1 tablet (100 mg total) by mouth daily., Disp: 30 tablet, Rfl: 2 Medication Side Effects: none  Family Medical/ Social History: Changes? No  MENTAL HEALTH EXAM:  There were no vitals taken for this visit.There is no height or weight on file to calculate BMI.  General Appearance: Casual, Neat, and Well Groomed  Eye Contact:  Good  Speech:  Clear and Coherent  Volume:  Normal  Mood:  NA  Affect:  Appropriate  Thought Process:  Coherent  Orientation:  Full  (Time, Place, and Person)  Thought Content: WDL   Suicidal Thoughts:  No  Homicidal Thoughts:  No  Memory:  WNL  Judgement:  Good  Insight:  Good  Psychomotor Activity:  Normal  Concentration:  Concentration: Good  Recall:  Good  Fund of Knowledge: Good  Language: Good  Assets:  Desire for Improvement  ADL's:  Intact  Cognition: WNL  Prognosis:  Good    DIAGNOSES:    ICD-10-CM   1. Major depressive disorder, recurrent episode, moderate (HCC)  F33.1 lamoTRIgine (LAMICTAL) 100 MG tablet    buPROPion (WELLBUTRIN XL) 150 MG 24 hr tablet    2. Generalized anxiety disorder  F41.1 lamoTRIgine (LAMICTAL) 100 MG tablet    buPROPion (WELLBUTRIN XL) 150 MG 24 hr tablet    3. Unspecified mood (affective) disorder (HCC)  F39 lamoTRIgine (LAMICTAL) 100 MG tablet      Receiving Psychotherapy: No    RECOMMENDATIONS:   Continue Wellbutrin to 300 mg XL daily. To increase Lamictal to 100 mg daily. Will report any side effects or worsening symptoms of depression promptly. To follow up in 2 months to reassess.  Greater than 50% of  30 min. face to face time with patient was spent on counseling and coordination of care. We discussed his recent improvements with anxiety and depression but he is reporting some increased irritability/agitation since increasing the Wellbutrin.   We talked about pros and cons of different medications and their side effects.  After consulting with patient he agreed to increase dose of Lamictal  to 100 mg to assist with moods. If improvement occurs, may consider reducing Wellbutrin next f/u. Patient is trying to avoid medications that cause sexual SE. Reviewed with pt possible SE of Lamictal and to be aware of rare rash. Educated on SJS. Cautioned patient on ETOH use with medication.        [name], NP

## 2020-12-14 ENCOUNTER — Other Ambulatory Visit: Payer: Self-pay

## 2020-12-14 ENCOUNTER — Telehealth: Payer: Self-pay | Admitting: Behavioral Health

## 2020-12-14 DIAGNOSIS — F331 Major depressive disorder, recurrent, moderate: Secondary | ICD-10-CM

## 2020-12-14 DIAGNOSIS — F411 Generalized anxiety disorder: Secondary | ICD-10-CM

## 2020-12-14 MED ORDER — BUPROPION HCL ER (XL) 150 MG PO TB24: ORAL_TABLET | ORAL | 1 refills | Status: DC

## 2020-12-14 NOTE — Telephone Encounter (Signed)
Pt called.  New insurance doesn't use [facility].  Send Wellbutrin Rx to [facility] in [place], [place] per patient's request  Direct calls to pt. @ [phone]

## 2020-12-14 NOTE — Telephone Encounter (Signed)
Rx sent 

## 2021-01-08 ENCOUNTER — Other Ambulatory Visit: Payer: Self-pay

## 2021-01-08 DIAGNOSIS — F331 Major depressive disorder, recurrent, moderate: Secondary | ICD-10-CM

## 2021-01-08 DIAGNOSIS — F411 Generalized anxiety disorder: Secondary | ICD-10-CM

## 2021-01-08 DIAGNOSIS — F39 Unspecified mood [affective] disorder: Secondary | ICD-10-CM

## 2021-01-08 MED ORDER — LAMOTRIGINE 100 MG PO TABS: 100.0000 mg | ORAL_TABLET | Freq: Every day | ORAL | 0 refills | Status: DC

## 2021-01-08 NOTE — Telephone Encounter (Signed)
Per Pt's insur change, (Note on 8/26) he needs to use [facility]. Please send Lamictal also to [facility] in [place], [place]. He can no longer use [facility]. THX

## 2021-01-08 NOTE — Telephone Encounter (Signed)
Rx sent 

## 2021-01-21 ENCOUNTER — Other Ambulatory Visit: Payer: Self-pay

## 2021-01-21 ENCOUNTER — Ambulatory Visit (INDEPENDENT_AMBULATORY_CARE_PROVIDER_SITE_OTHER): Payer: 59 | Admitting: Behavioral Health

## 2021-01-21 ENCOUNTER — Encounter: Payer: Self-pay | Admitting: Behavioral Health

## 2021-01-21 DIAGNOSIS — F331 Major depressive disorder, recurrent, moderate: Secondary | ICD-10-CM | POA: Diagnosis not present

## 2021-01-21 DIAGNOSIS — F411 Generalized anxiety disorder: Secondary | ICD-10-CM | POA: Diagnosis not present

## 2021-01-21 DIAGNOSIS — F39 Unspecified mood [affective] disorder: Secondary | ICD-10-CM | POA: Diagnosis not present

## 2021-01-21 MED ORDER — LAMOTRIGINE 100 MG PO TABS: 150.0000 mg | ORAL_TABLET | Freq: Every day | ORAL | 3 refills | Status: DC

## 2021-01-21 MED ORDER — BUPROPION HCL ER (XL) 150 MG PO TB24: ORAL_TABLET | ORAL | 3 refills | Status: DC

## 2021-01-21 NOTE — Progress Notes (Signed)
[facility] Med Check  Patient ID: Ivan Ortiz,  MRN: [id number]  PCP: Eber Jones, NP  Date of Evaluation: 01/21/2021 Time spent:30 minutes  Chief Complaint:  Chief Complaint   Anxiety; Depression; Follow-up; Medication Refill     HISTORY/CURRENT STATUS: HPI  Individual Medical History/ Review of Systems: Changes? :No   Allergies: Patient has no known allergies.  Current Medications:  Current Outpatient Medications:    buPROPion (WELLBUTRIN XL) 150 MG 24 hr tablet, TAKE 2 TABS DAILY, Disp: 60 tablet, Rfl: 3   lamoTRIgine (LAMICTAL) 100 MG tablet, Take 1.5 tablets (150 mg total) by mouth daily., Disp: 45 tablet, Rfl: 3 Medication Side Effects: none  Family Medical/ Social History: Changes? No  MENTAL HEALTH EXAM:  There were no vitals taken for this visit.There is no height or weight on file to calculate BMI.  General Appearance: Casual and Neat  Eye Contact:  Good  Speech:  Clear and Coherent  Volume:  Normal  Mood:  Anxious and Depressed  Affect:  Appropriate  Thought Process:  Coherent  Orientation:  Full (Time, Place, and Person)  Thought Content: Logical   Suicidal Thoughts:  No  Homicidal Thoughts:  No  Memory:  WNL  Judgement:  Good  Insight:  Good  Psychomotor Activity:  Normal  Concentration:  Concentration: Good  Recall:  Good  Fund of Knowledge: Good  Language: Good  Assets:  Desire for Improvement  ADL's:  Intact  Cognition: WNL  Prognosis:  Good    DIAGNOSES:    ICD-10-CM   1. Major depressive disorder, recurrent episode, moderate (HCC)  F33.1 lamoTRIgine (LAMICTAL) 100 MG tablet    buPROPion (WELLBUTRIN XL) 150 MG 24 hr tablet    2. Generalized anxiety disorder  F41.1 lamoTRIgine (LAMICTAL) 100 MG tablet    buPROPion (WELLBUTRIN XL) 150 MG 24 hr tablet    3. Unspecified mood (affective) disorder (HCC)  F39 lamoTRIgine (LAMICTAL) 100 MG tablet      Receiving Psychotherapy: No    RECOMMENDATIONS:   Continue Wellbutrin  to 300 mg XL daily. To increase Lamictal to 150 mg daily. Will report any side effects or worsening symptoms of depression promptly. To follow up in 3 months to reassess.  Greater than 50% of  30 min. face to face time with patient was spent on counseling and coordination of care. We discussed his recent improvements with anxiety and depression but he is reporting some increased irritability/agitation since increasing the Wellbutrin.  He agrees that while much improved continued adjustments to medications are indicated at this time. We talked about pros and cons of different medications and their side effects.  Pt expressed interest in seeking psychological testing for ADHD. To consider appointment with [facility].  Patient is trying to avoid medications that cause sexual SE. Reviewed with pt possible SE of Lamictal and to be aware of rare rash. Educated on SJS. Cautioned patient on ETOH use with medication.  Reviewed PDMP   [name], NP

## 2021-04-26 ENCOUNTER — Telehealth (INDEPENDENT_AMBULATORY_CARE_PROVIDER_SITE_OTHER): Payer: BC Managed Care – PPO | Admitting: Behavioral Health

## 2021-04-26 ENCOUNTER — Encounter: Payer: Self-pay | Admitting: Behavioral Health

## 2021-04-26 DIAGNOSIS — F39 Unspecified mood [affective] disorder: Secondary | ICD-10-CM | POA: Diagnosis not present

## 2021-04-26 DIAGNOSIS — F331 Major depressive disorder, recurrent, moderate: Secondary | ICD-10-CM

## 2021-04-26 DIAGNOSIS — F411 Generalized anxiety disorder: Secondary | ICD-10-CM | POA: Diagnosis not present

## 2021-04-26 MED ORDER — LAMOTRIGINE 100 MG PO TABS: 200.0000 mg | ORAL_TABLET | Freq: Every day | ORAL | 3 refills | Status: DC

## 2021-04-26 MED ORDER — BUPROPION HCL ER (XL) 150 MG PO TB24: ORAL_TABLET | ORAL | 3 refills | Status: DC

## 2021-04-26 NOTE — Progress Notes (Signed)
Ivan Ortiz [id number] 05/21/1985 36 y.o.  Virtual Visit via Video Note  I connected with pt @ on 04/26/21 at  1:00 PM EST by a video enabled telemedicine application and verified that I am speaking with the correct person using two identifiers.   I discussed the limitations of evaluation and management by telemedicine and the availability of in person appointments. The patient expressed understanding and agreed to proceed.  I discussed the assessment and treatment plan with the patient. The patient was provided an opportunity to ask questions and all were answered. The patient agreed with the plan and demonstrated an understanding of the instructions.   The patient was advised to call back or seek an in-person evaluation if the symptoms worsen or if the condition fails to improve as anticipated.  I provided 30 minutes of non-face-to-face time during this encounter.  The patient was located at home.  The provider was located at [place].   Elwanda Brooklyn, NP   Subjective:   Patient ID:  Ivan Ortiz is a 36 y.o. (DOB 1986/04/02) male.  Chief Complaint:  Chief Complaint  Patient presents with   Anxiety   Depression   Follow-up   Medication Refill    HPI  36 year old male presents to this office for follow up and medication management. He says he feels like his medications have helped to a large degree but right now he is feeling a wide array of emotions due to him in his wife having a marital crisis. He was advised to get immediate marriage counseling if his wife was agreeable. He says he is also interested in increasing the Lamictal to help with moods, but he also understands that he is experiencing situational effects at this time that he needs to work through. He says that he does have a few beers daily but understands to be cautious with mixing with medication. He reports his anxiety today at 5/10 and depression at 6/10. He has reached out to mentors at church  recently. He agrees to follow up in 4 weeks. He denies mania, no psychosis. No SI/HI.     Past psychiatric medication trials Lexapro Wellbutrin Buspar  Review of Systems:  Review of Systems  Constitutional: Negative.   Musculoskeletal:  Negative for gait problem.  Allergic/Immunologic: Negative.   Neurological:  Negative for tremors.  Psychiatric/Behavioral:  Positive for agitation and dysphoric mood. The patient is nervous/anxious.    Medications: I have reviewed the patient's current medications.  Current Outpatient Medications  Medication Sig Dispense Refill   buPROPion (WELLBUTRIN XL) 150 MG 24 hr tablet TAKE 2 TABS DAILY 60 tablet 3   lamoTRIgine (LAMICTAL) 100 MG tablet Take 2 tablets (200 mg total) by mouth daily. 60 tablet 3   No current facility-administered medications for this visit.    Medication Side Effects: None  Allergies: No Known Allergies  Past Medical History:  Diagnosis Date   RBBB     No family history on file.  Social History   Socioeconomic History   Marital status: Married    Spouse name: [name]   Number of children: 4   Years of education: 16   Highest education level: Bachelor's degree (e.g., BA, AB, BS)  Occupational History   Occupation: Air cabin crew  Tobacco Use   Smoking status: Every Day    Packs/day: 0.50    Types: Cigarettes   Smokeless tobacco: Never  Substance and Sexual Activity   Alcohol use: Yes    Comment:  occ   Drug use: Not on file   Sexual activity: Yes    Comment: Wife  Other Topics Concern   Not on file  Social History Narrative   Lives with wife and 4 children in [place].   Social Determinants of Health   Financial Resource Strain: Not on file  Food Insecurity: Not on file  Transportation Needs: Not on file  Physical Activity: Not on file  Stress: Not on file  Social Connections: Not on file  Intimate Partner Violence: Not on file    Past Medical History, Surgical history, Social  history, and Family history were reviewed and updated as appropriate.   Please see review of systems for further details on the patient's review from today.   Objective:   Physical Exam:  There were no vitals taken for this visit.  Physical Exam Constitutional:      General: He is not in acute distress.    Appearance: Normal appearance.  Neurological:     Mental Status: He is alert and oriented to person, place, and time.     Gait: Gait normal.  Psychiatric:        Attention and Perception: Attention and perception normal. He does not perceive auditory or visual hallucinations.        Mood and Affect: Mood and affect normal. Mood is not anxious or depressed. Affect is not labile.        Speech: Speech normal.        Behavior: Behavior normal. Behavior is cooperative.        Thought Content: Thought content normal.        Cognition and Memory: Cognition and memory normal.        Judgment: Judgment normal.    Lab Review:  No results found for: NA, K, CL, CO2, GLUCOSE, BUN, CREATININE, CALCIUM, PROT, ALBUMIN, AST, ALT, ALKPHOS, BILITOT, GFRNONAA, GFRAA     Component Value Date/Time   WBC 5.7 10/26/2015 1015   RBC 5.46 10/26/2015 1015   HGB 17.4 10/26/2015 1015   HCT 49.3 10/26/2015 1015   MCV 90.1 10/26/2015 1015   MCH 31.9 (A) 10/26/2015 1015   MCHC 35.4 10/26/2015 1015    No results found for: POCLITH, LITHIUM   No results found for: PHENYTOIN, PHENOBARB, VALPROATE, CBMZ   .res Assessment: Plan:    Abdulaziz was seen today for anxiety, depression, follow-up and medication refill.  Diagnoses and all orders for this visit:  Major depressive disorder, recurrent episode, moderate (HCC) -     lamoTRIgine (LAMICTAL) 100 MG tablet; Take 2 tablets (200 mg total) by mouth daily. -     buPROPion (WELLBUTRIN XL) 150 MG 24 hr tablet; TAKE 2 TABS DAILY  Generalized anxiety disorder -     lamoTRIgine (LAMICTAL) 100 MG tablet; Take 2 tablets (200 mg total) by mouth daily. -      buPROPion (WELLBUTRIN XL) 150 MG 24 hr tablet; TAKE 2 TABS DAILY  Unspecified mood (affective) disorder (HCC) -     lamoTRIgine (LAMICTAL) 100 MG tablet; Take 2 tablets (200 mg total) by mouth daily.   RECOMMENDATIONS:    Continue Wellbutrin to 300 mg XL daily. To increase Lamictal to 200 mg daily. Will report any side effects or worsening symptoms of depression promptly. To follow up in 4 weeks to reassess.  Greater than 50% of  30 min. face to face time with patient was spent on counseling and coordination of care. We discussed his recent improvements with anxiety and depression  but he is going through a tough marital situation that has increased negative emotions. He was highly encouraged to seek immediate marriage counseling.   Monitor for any sign of rash. Please taking Lamictal and contact office immediately rash develops. Recommend seeking urgent medical attention if rash is severe and/or spreading quickly.  Reviewed PDMP  Reviewed PDMP     Elwanda Brooklyn, PMHNP-BC       Please see After Visit Summary for patient specific instructions.  Future Appointments  Date Time Provider [place]  05/24/2021 10:30 AM Lesle Chris A, NP [facility] None    No orders of the defined types were placed in this encounter.     -------------------------------

## 2021-05-24 ENCOUNTER — Encounter: Payer: Self-pay | Admitting: Behavioral Health

## 2021-05-24 ENCOUNTER — Telehealth (INDEPENDENT_AMBULATORY_CARE_PROVIDER_SITE_OTHER): Payer: BC Managed Care – PPO | Admitting: Behavioral Health

## 2021-05-24 DIAGNOSIS — F39 Unspecified mood [affective] disorder: Secondary | ICD-10-CM

## 2021-05-24 DIAGNOSIS — F411 Generalized anxiety disorder: Secondary | ICD-10-CM | POA: Diagnosis not present

## 2021-05-24 DIAGNOSIS — F331 Major depressive disorder, recurrent, moderate: Secondary | ICD-10-CM

## 2021-05-24 NOTE — Progress Notes (Signed)
Ivan Ortiz [id number] 10-23-1985 36 y.o.  Virtual Visit via Video Note  I connected with pt @ on 05/24/21 at 10:30 AM EST by a video enabled telemedicine application and verified that I am speaking with the correct person using two identifiers.   I discussed the limitations of evaluation and management by telemedicine and the availability of in person appointments. The patient expressed understanding and agreed to proceed.  I discussed the assessment and treatment plan with the patient. The patient was provided an opportunity to ask questions and all were answered. The patient agreed with the plan and demonstrated an understanding of the instructions.   The patient was advised to call back or seek an in-person evaluation if the symptoms worsen or if the condition fails to improve as anticipated.  I provided 20 minutes of non-face-to-face time during this encounter.  The patient was located at home.  The provider was located at [place].   Elwanda Brooklyn, NP   Subjective:   Patient ID:  Ivan Ortiz is a 36 y.o. (DOB Jan 30, 1986) male.  Chief Complaint:  Chief Complaint  Patient presents with   Anxiety   Depression   Follow-up   Family Problem   Medication Problem    HPI  36 year old male presents to this office for follow up and medication management. He says he feels like his medications have helped to a large degree but right now he is feeling a wide array of emotions due to him in his wife having a marital crisis. He and his wife did seek [place] counseling and marriage therapy after I last visit and he is currently having active sessions. He is optimistic that he and his wife are progressing in finding resolve in their situation. Marland Kitchen He says that he does have a few beers daily but understands to be cautious with mixing with medication. He discussed wanting to possibly wean off his meds but agreed that this may not be a good time to make drastic changes. He  reports his anxiety today at 5/10 and depression at 5/10. He has reached out to mentors at church recently. He agrees to follow up in 2 months and continue marriage counseling. He denies mania, no psychosis. No SI/HI.     Past psychiatric medication trials Lexapro Wellbutrin Buspar   Review of Systems:  Review of Systems  Musculoskeletal:  Negative for gait problem.  Allergic/Immunologic: Negative.   Neurological: Negative.  Negative for tremors.  Psychiatric/Behavioral:  Positive for dysphoric mood. The patient is nervous/anxious.    Medications: I have reviewed the patient's current medications.  Current Outpatient Medications  Medication Sig Dispense Refill   buPROPion (WELLBUTRIN XL) 150 MG 24 hr tablet TAKE 2 TABS DAILY 60 tablet 3   lamoTRIgine (LAMICTAL) 100 MG tablet Take 2 tablets (200 mg total) by mouth daily. 60 tablet 3   No current facility-administered medications for this visit.    Medication Side Effects: None  Allergies: No Known Allergies  Past Medical History:  Diagnosis Date   RBBB     No family history on file.  Social History   Socioeconomic History   Marital status: Married    Spouse name: [name]   Number of children: 4   Years of education: 16   Highest education level: Bachelor's degree (e.g., BA, AB, BS)  Occupational History   Occupation: Air cabin crew  Tobacco Use   Smoking status: Every Day    Packs/day: 0.50    Types: Cigarettes  Smokeless tobacco: Never  Substance and Sexual Activity   Alcohol use: Yes    Comment: occ   Drug use: Not on file   Sexual activity: Yes    Comment: Wife  Other Topics Concern   Not on file  Social History Narrative   Lives with wife and 4 children in [place].   Social Determinants of Health   Financial Resource Strain: Not on file  Food Insecurity: Not on file  Transportation Needs: Not on file  Physical Activity: Not on file  Stress: Not on file  Social Connections: Not on file   Intimate Partner Violence: Not on file    Past Medical History, Surgical history, Social history, and Family history were reviewed and updated as appropriate.   Please see review of systems for further details on the patient's review from today.   Objective:   Physical Exam:  There were no vitals taken for this visit.  Physical Exam Constitutional:      General: He is not in acute distress.    Appearance: Normal appearance.  Neurological:     Mental Status: He is alert and oriented to person, place, and time.     Gait: Gait normal.  Psychiatric:        Attention and Perception: Attention and perception normal. He does not perceive auditory or visual hallucinations.        Mood and Affect: Mood and affect normal. Mood is not anxious or depressed. Affect is not labile.        Speech: Speech normal.        Behavior: Behavior normal. Behavior is cooperative.        Thought Content: Thought content normal.        Cognition and Memory: Cognition and memory normal.        Judgment: Judgment normal.    Lab Review:  No results found for: NA, K, CL, CO2, GLUCOSE, BUN, CREATININE, CALCIUM, PROT, ALBUMIN, AST, ALT, ALKPHOS, BILITOT, GFRNONAA, GFRAA     Component Value Date/Time   WBC 5.7 10/26/2015 1015   RBC 5.46 10/26/2015 1015   HGB 17.4 10/26/2015 1015   HCT 49.3 10/26/2015 1015   MCV 90.1 10/26/2015 1015   MCH 31.9 (A) 10/26/2015 1015   MCHC 35.4 10/26/2015 1015    No results found for: POCLITH, LITHIUM   No results found for: PHENYTOIN, PHENOBARB, VALPROATE, CBMZ   .res Assessment: Plan:    Rhylin was seen today for anxiety, depression, follow-up, family problem and medication problem.  Diagnoses and all orders for this visit:  Major depressive disorder, recurrent episode, moderate (HCC)  Generalized anxiety disorder  Unspecified mood (affective) disorder (HCC)   Continue Wellbutrin to 300 mg XL daily. To continue Lamictal to 200 mg daily. Will report any  side effects or worsening symptoms of depression promptly. To follow up in 8 weeks to reassess.  Greater than 50% of  30 min. face to face time with patient was spent on counseling and coordination of care. We discussed his recent improvements with anxiety and depression but he is continuing to go through a tough marital situation that has increased negative emotions. He was highly encouraged to seek immediate marriage counseling and he and his spouse have done so. They are currently in regular sessions. We discussed his thoughts about weaning off medication because of his concerns that he has not seen real benefit. I advised that making sudden changes when you are experiencing new stressor and life changing events is not  always the best time to make radical changes with you medication regimen. Advised to continue to monitor and revisit this in the future. Monitor for any sign of rash. Please taking Lamictal and contact office immediately rash develops. Recommend seeking urgent medical attention if rash is severe and/or spreading quickly.  Reviewed PDMP   Reviewed PDMP    Please see After Visit Summary for patient specific instructions.  No future appointments.  No orders of the defined types were placed in this encounter.     -------------------------------

## 2021-07-09 ENCOUNTER — Telehealth: Payer: Self-pay | Admitting: Behavioral Health

## 2021-07-09 NOTE — Telephone Encounter (Signed)
Yes, he can start the Wellbutrin 450 mg daily and will follow up with him on 4/3.  Please advise him.

## 2021-07-09 NOTE — Telephone Encounter (Signed)
Ivan Ortiz called today at 9:49am to asked permission to increase his Wellbutrin to 450mg /day.  you had discussed it and he is ready to try this.  If you okay this, he can start the increase today and be able to report how it is working at his appt 07/22/21.  He does not need a prescription sent in.  He has enough to do the increase and be able to get to his appt.  He just needs the ok to increase the medication. ?

## 2021-07-09 NOTE — Telephone Encounter (Signed)
Please review

## 2021-07-09 NOTE — Telephone Encounter (Signed)
LVM with info

## 2021-07-16 ENCOUNTER — Emergency Department (HOSPITAL_COMMUNITY)
Admission: EM | Admit: 2021-07-16 | Discharge: 2021-07-16 | Disposition: A | Discharge status: Home / Self Care | Payer: BC Managed Care – PPO | Attending: Emergency Medicine | Admitting: Emergency Medicine

## 2021-07-16 ENCOUNTER — Emergency Department (HOSPITAL_COMMUNITY): Payer: BC Managed Care – PPO

## 2021-07-16 ENCOUNTER — Encounter (HOSPITAL_COMMUNITY): Payer: Self-pay | Admitting: Pharmacy Technician

## 2021-07-16 DIAGNOSIS — N433 Hydrocele, unspecified: Secondary | ICD-10-CM | POA: Diagnosis not present

## 2021-07-16 DIAGNOSIS — R1032 Left lower quadrant pain: Secondary | ICD-10-CM | POA: Diagnosis present

## 2021-07-16 NOTE — ED Provider Triage Note (Signed)
Emergency Medicine Provider Triage Evaluation Note ? ?Ivan Ortiz , a 36 y.o. male  was evaluated in triage.  Pt complains of left testicle pain.  Says the pain started about 24 hours ago.  Initially it just felt uncomfortable.  He has intermittently had some severe waves of pain.  He also noticed that he is left testicle was more high rising than normal.  He put ice on it today which helped the symptoms somewhat.  He went to the urgent care today who took a look at him and said that he needed to go to the emergency room to have a torsion ruled out.  Patient denies any trauma.  Denies fevers or chills.  Denies any penile discharge, swelling, or color change to testicle.  Right now he has 4 out of 10 pain. ?Review of Systems  ?Positive: Testicular pain ?Negative:  ? ?Physical Exam  ?BP 128/80 (BP Location: Right Arm)   Pulse 65   Temp 98.6 ?F (37 ?C) (Oral)   Resp 14   SpO2 99%  ?Gen:   Awake, no distress   ?Resp:  Normal effort  ?MSK:   Moves extremities without difficulty  ?Other:   ? ?Medical Decision Making  ?Medically screening exam initiated at 4:56 PM.  Appropriate orders placed.  BLAIKE VICKERS was informed that the remainder of the evaluation will be completed by another provider, this initial triage assessment does not replace that evaluation, and the importance of remaining in the ED until their evaluation is complete. ? ? ?  ?Claudie Leach, PA-C ?07/16/21 1658 ? ?

## 2021-07-16 NOTE — ED Notes (Signed)
Dc instructions reviewed with pt. PT verbalized understanding. Pt DC.  °

## 2021-07-16 NOTE — Discharge Instructions (Signed)
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  I spoke with urology who feels you are safe for discharge home but they do want to see you in clinic in the next few days.  Please call to arrange that appointment.  I spoke with Dr. Louis Meckel who requested that evaluation.  If any symptoms change or worsen acutely, please return to the nearest emergency department. ?

## 2021-07-16 NOTE — ED Provider Notes (Signed)
?[facility] ?Provider Note ? ? ?CSN: [id number] ?Arrival date & time: 07/16/21  1638 ? ?  ? ?History ? ?No chief complaint on file. ? ? ?Ivan Ortiz is a 36 y.o. male. ? ?The history is provided by the patient, medical records and the spouse. No language interpreter was used.  ?Groin Pain ?This is a new problem. The current episode started yesterday. The problem occurs constantly. The problem has been rapidly improving. Pertinent negatives include no chest pain, no abdominal pain, no headaches and no shortness of breath. Nothing aggravates the symptoms. Nothing relieves the symptoms. He has tried nothing for the symptoms. The treatment provided no relief.  ? ?  ? ?Home Medications ?Prior to Admission medications   ?Medication Sig Start Date End Date Taking? Authorizing Provider  ?buPROPion (WELLBUTRIN XL) 150 MG 24 hr tablet TAKE 2 TABS DAILY 04/26/21   Elwanda Brooklyn, NP  ?lamoTRIgine (LAMICTAL) 100 MG tablet Take 2 tablets (200 mg total) by mouth daily. 04/26/21   Elwanda Brooklyn, NP  ?   ? ?Allergies    ?Patient has no known allergies.   ? ?Review of Systems   ?Review of Systems  ?Constitutional:  Negative for chills, fatigue and fever.  ?HENT:  Negative for ear pain and sore throat.   ?Eyes:  Negative for pain and visual disturbance.  ?Respiratory:  Negative for cough and shortness of breath.   ?Cardiovascular:  Negative for chest pain, palpitations and leg swelling.  ?Gastrointestinal:  Negative for abdominal pain, constipation, diarrhea, nausea and vomiting.  ?Genitourinary:  Positive for scrotal swelling and testicular pain. Negative for decreased urine volume, dysuria, flank pain, frequency, hematuria, penile pain and penile swelling.  ?Musculoskeletal:  Negative for arthralgias and back pain.  ?Skin:  Negative for color change, rash and wound.  ?Neurological:  Negative for seizures, syncope, light-headedness and headaches.  ?Psychiatric/Behavioral:  Negative for  agitation.   ?All other systems reviewed and are negative. ? ?Physical Exam ?Updated Vital Signs ?BP 128/80 (BP Location: Right Arm)   Pulse 65   Temp 98.6 ?F (37 ?C) (Oral)   Resp 14   SpO2 99%  ?Physical Exam ?Vitals and nursing note reviewed. Exam conducted with a chaperone present.  ?Constitutional:   ?   General: He is not in acute distress. ?   Appearance: He is well-developed. He is not ill-appearing, toxic-appearing or diaphoretic.  ?HENT:  ?   Head: Normocephalic and atraumatic.  ?Eyes:  ?   Conjunctiva/sclera: Conjunctivae normal.  ?Cardiovascular:  ?   Rate and Rhythm: Normal rate and regular rhythm.  ?   Heart sounds: No murmur heard. ?Pulmonary:  ?   Effort: Pulmonary effort is normal. No respiratory distress.  ?   Breath sounds: Normal breath sounds. No wheezing, rhonchi or rales.  ?Chest:  ?   Chest wall: No tenderness.  ?Abdominal:  ?   General: Abdomen is flat. There is no distension.  ?   Palpations: Abdomen is soft.  ?   Tenderness: There is no abdominal tenderness. There is no right CVA tenderness, left CVA tenderness, guarding or rebound.  ?   Hernia: There is no hernia in the left inguinal area or right inguinal area.  ?Genitourinary: ?   Penis: Normal. No lesions.   ?   Testes:     ?   Right: Tenderness not present.     ?   Left: Tenderness present. Swelling not present. Left testis is descended. Cremasteric reflex is  present.   ?   Comments: Very mild tenderness to left proximal scrotal area.  Minimal tenderness to left testicle. ?Musculoskeletal:     ?   General: No swelling or tenderness.  ?   Cervical back: Neck supple.  ?   Right lower leg: No edema.  ?   Left lower leg: No edema.  ?Skin: ?   General: Skin is warm and dry.  ?   Capillary Refill: Capillary refill takes less than 2 seconds.  ?Neurological:  ?   Mental Status: He is alert. Mental status is at baseline.  ?Psychiatric:     ?   Mood and Affect: Mood normal.  ? ? ?ED Results / Procedures / Treatments   ?Labs ?(all labs  ordered are listed, but only abnormal results are displayed) ?Labs Reviewed - No data to display ? ?EKG ?None ? ?Radiology ?US SCROTUM W/DOPPLER ? ?Result Date: 07/16/2021 ?CLINICAL DATA:  Testicular torsion EXAM: SCROTAL ULTRASOUND DOPPLER ULTRASOUND OF THE TESTICLES TECHNIQUE: Complete ultrasound examination of the testicles, epididymis, and other scrotal structures was performed. Color and spectral Doppler ultrasound were also utilized to evaluate blood flow to the testicles. COMPARISON:  None. FINDINGS: Right testicle Measurements: 4.2 x 2.4 x 3.2 cm. No mass or microlithiasis visualized. Left testicle Measurements: 4.5 x 2.3 x 3.2 cm. No mass or microlithiasis visualized. Right epididymis:  Normal in size and appearance. Left epididymis:  Normal in size and appearance. Hydrocele:  Small bilateral hydroceles. Varicocele:  None visualized. Pulsed Doppler interrogation of both testes demonstrates normal low resistance arterial and venous waveforms bilaterally. IMPRESSION: No evidence of torsion or epididymo-orchitis. Small bilateral hydroceles. Electronically Signed   By: Maurine Simmering M.D.   On: 07/16/2021 17:36   ? ?Procedures ?Procedures  ? ? ?Medications Ordered in ED ?Medications - No data to display ? ?ED Course/ Medical Decision Making/ A&P ?  ?                        ?Medical Decision Making ? ? ?Ivan Ortiz is a 36 y.o. male with a past medical history significant for previous appendectomy and previous vasectomy who presents with left scrotal pain.  According to patient, he thinks he had some mild discomfort yesterday evening but did not focus on it but today throughout the morning his left scrotum and left testicle started hurting worse.  He reports is extremely severe and when he inspected his groin appear that his left testicle was sitting in an abnormal lie and was more elevated and proximal than normal.  He reports that he called [facility] and was told to come to emergency department for  evaluation as they could not fit him in for ultrasound or eval.  He then went to an urgent care and was told his need to come to the emergency department for ultrasound to rule out torsion.  He reports the pain has waxed and waned and is now improved and he reports that his testicle seems to be sitting more normal currently.  He denies fevers, chills, preceding symptoms, nausea, vomiting, constipation, diarrhea, or any urinary symptoms.  He denies dysuria, hematuria, discharge, or change in appearance.  He denies any rashes.  Denies any history of hernias.  Denies any trauma. ? ?On exam, lungs clear and chest nontender.  Abdomen nontender.  A chaperone was present and his groin was evaluated.  His left testicle was not exquisitely tender to palpation on my exam nor did he have  evidence of hernia.  His cremasteric reflex appeared intact bilaterally and he had a more normal lie and the scrotum was symmetric. ? ?Exam otherwise unremarkable. ? ?Patient had ultrasound in triage that showed bilateral hydroceles that were small but otherwise no evidence of acute torsion or infection. ? ?Patient does paint a compelling story with an abnormal lie that was reportedly high riding with severe pain that waxed and waned.  Despite the reassuring ultrasound, we will call urology to make sure they do not want to see him more emergently and to help ensure he gets close follow-up. ? ?Patient is agreement with this plan, anticipate reassessment after urology consultation. ? ?8:42 PM ?Urology was called and I spoke to Dr. Louis Meckel.  He reviewed all the ultrasound images and agrees that he does not think the patient had acute torsion at this time.  We discussed pulse intermittent torsion but given the patient's resolution of symptoms and reassuring ultrasound, he does not feel that surgery is indicated at this time tonight.  He does however want patient to be seen in clinic and agrees with strict return precautions for any new or  worsening symptoms. ? ?I spoke with patient he agrees with plan.  Will discharge with instruction to follow-up with [facility] in the next 24 to 72 hours. ? ? ? ? ? ? ? ?Final Clinical Impression(s) / ED Diagnoses ?Final di

## 2021-07-16 NOTE — ED Triage Notes (Signed)
Pt here with reports of intermittent testicular pain onset yesterday. Denies dysuria.  ?

## 2021-07-22 ENCOUNTER — Encounter: Payer: Self-pay | Admitting: Behavioral Health

## 2021-07-22 ENCOUNTER — Telehealth (INDEPENDENT_AMBULATORY_CARE_PROVIDER_SITE_OTHER): Payer: BC Managed Care – PPO | Admitting: Behavioral Health

## 2021-07-22 DIAGNOSIS — F411 Generalized anxiety disorder: Secondary | ICD-10-CM

## 2021-07-22 DIAGNOSIS — F39 Unspecified mood [affective] disorder: Secondary | ICD-10-CM

## 2021-07-22 DIAGNOSIS — F331 Major depressive disorder, recurrent, moderate: Secondary | ICD-10-CM | POA: Diagnosis not present

## 2021-07-22 MED ORDER — LAMOTRIGINE 100 MG PO TABS: 200.0000 mg | ORAL_TABLET | Freq: Every day | ORAL | 3 refills | Status: DC

## 2021-07-22 MED ORDER — BUPROPION HCL ER (XL) 150 MG PO TB24: 450.0000 mg | ORAL_TABLET | Freq: Every day | ORAL | 1 refills | Status: DC

## 2021-07-22 NOTE — Progress Notes (Signed)
Ivan Ortiz ?[id number] ?[birth year] ?36 y.o. ? ?Virtual Visit via Video Note ? ?I connected with pt @ on 07/22/21 at  8:30 AM EDT by a video enabled telemedicine application and verified that I am speaking with the correct person using two identifiers. ?  ?I discussed the limitations of evaluation and management by telemedicine and the availability of in person appointments. The patient expressed understanding and agreed to proceed. ? ?I discussed the assessment and treatment plan with the patient. The patient was provided an opportunity to ask questions and all were answered. The patient agreed with the plan and demonstrated an understanding of the instructions. ?  ?The patient was advised to call back or seek an in-person evaluation if the symptoms worsen or if the condition fails to improve as anticipated. ? ?I provided 20 minutes of non-face-to-face time during this encounter.  The patient was located at home.  The provider was located at [facility]. ? ? ?[name], NP  ? ?Subjective:  ? ?Patient ID:  Ivan Ortiz is a 36 y.o. (DOB 1985-12-13) male. ? ?Chief Complaint:  ?Chief Complaint  ?Patient presents with  ? Anxiety  ? Depression  ? Follow-up  ? Medication Refill  ? Medication Problem  ? ? ?HPI ? ?36 year old male presents to this office for follow up and medication management. He says he feels like his medications have helped to a large degree. Says he was hoping Wellbutrin would have a more significant effect since he went up to 450 mg daily. He understand it has only been about 4 weeks and would like to continue on this regiment for a longer period before considering changes. He and his wife are still in marital counseling. He reports his anxiety today at 4/10 and depression at 4/10. He has reached out to mentors at church recently. He would like to continue to f/u every two months for now.  He denies mania, no psychosis. No SI/HI.   ?  ?Past psychiatric medication  trials ?Lexapro ?Wellbutrin ? ?Review of Systems:  ?Review of Systems ? ?Medications: I have reviewed the patient's current medications. ? ?Current Outpatient Medications  ?Medication Sig Dispense Refill  ? buPROPion (WELLBUTRIN XL) 150 MG 24 hr tablet Take 3 tablets (450 mg total) by mouth daily. 270 tablet 1  ? buPROPion (WELLBUTRIN XL) 150 MG 24 hr tablet TAKE 2 TABS DAILY 60 tablet 3  ? lamoTRIgine (LAMICTAL) 100 MG tablet Take 2 tablets (200 mg total) by mouth daily. 60 tablet 3  ? ?No current facility-administered medications for this visit.  ? ? ?Medication Side Effects: None ? ?Allergies: No Known Allergies ? ?Past Medical History:  ?Diagnosis Date  ? RBBB   ? ? ?No family history on file. ? ?Social History  ? ?Socioeconomic History  ? Marital status: Married  ?  Spouse name: [name]  ? Number of children: 4  ? Years of education: 72  ? Highest education level: Bachelor's degree (e.g., BA, AB, BS)  ?Occupational History  ? Occupation: Patent examiner  ?Tobacco Use  ? Smoking status: Every Day  ?  Packs/day: 0.50  ?  Types: Cigarettes  ? Smokeless tobacco: Never  ?Substance and Sexual Activity  ? Alcohol use: Yes  ?  Comment: occ  ? Drug use: Not on file  ? Sexual activity: Yes  ?  Comment: Wife  ?Other Topics Concern  ? Not on file  ?Social History Narrative  ? Lives with wife and 4 children in  [place].  ? ?Social Determinants of Health  ? ?Financial Resource Strain: Not on file  ?Food Insecurity: Not on file  ?Transportation Needs: Not on file  ?Physical Activity: Not on file  ?Stress: Not on file  ?Social Connections: Not on file  ?Intimate Partner Violence: Not on file  ? ? ?Past Medical History, Surgical history, Social history, and Family history were reviewed and updated as appropriate.  ? ?Please see review of systems for further details on the patient's review from today.  ? ?Objective:  ? ?Physical Exam:  ?There were no vitals taken for this visit. ? ?Physical Exam ? ?Lab Review:  ?No  results found for: NA, K, CL, CO2, GLUCOSE, BUN, CREATININE, CALCIUM, PROT, ALBUMIN, AST, ALT, ALKPHOS, BILITOT, GFRNONAA, GFRAA ? ?   ?Component Value Date/Time  ? WBC 5.7 10/26/2015 1015  ? RBC 5.46 10/26/2015 1015  ? HGB 17.4 10/26/2015 1015  ? HCT 49.3 10/26/2015 1015  ? MCV 90.1 10/26/2015 1015  ? MCH 31.9 (A) 10/26/2015 1015  ? MCHC 35.4 10/26/2015 1015  ? ? ?No results found for: POCLITH, LITHIUM  ? ?No results found for: PHENYTOIN, PHENOBARB, VALPROATE, CBMZ  ? ?.res ?Assessment: Plan:   ? ?Ivan Ortiz was seen today for anxiety, depression, follow-up, medication refill and medication problem. ? ?Diagnoses and all orders for this visit: ? ?Major depressive disorder, recurrent episode, moderate (HCC) ?-     lamoTRIgine (LAMICTAL) 100 MG tablet; Take 2 tablets (200 mg total) by mouth daily. ?-     buPROPion (WELLBUTRIN XL) 150 MG 24 hr tablet; Take 3 tablets (450 mg total) by mouth daily. ? ?Generalized anxiety disorder ?-     lamoTRIgine (LAMICTAL) 100 MG tablet; Take 2 tablets (200 mg total) by mouth daily. ?-     buPROPion (WELLBUTRIN XL) 150 MG 24 hr tablet; Take 3 tablets (450 mg total) by mouth daily. ? ?Unspecified mood (affective) disorder (HCC) ?-     lamoTRIgine (LAMICTAL) 100 MG tablet; Take 2 tablets (200 mg total) by mouth daily. ?-     buPROPion (WELLBUTRIN XL) 150 MG 24 hr tablet; Take 3 tablets (450 mg total) by mouth daily. ? ?  ? ? Continue Wellbutrin to 450 mg XL daily. ?To continue Lamictal to 200 mg daily. ?Will report any side effects or worsening symptoms of depression promptly. ?To follow up in 8 weeks to reassess.  ?Greater than 50% of  20  min. face to face video visit was spent on counseling and coordination of care. Discussed his current level of stability. He felt like Wellbutrin would have more of an impact but he is coping.  He agrees to discuss other medication options next visit if not significantly improved. He and his wife are still working through some marital issues. Advised to  continue to monitor and revisit this in the future. ?Monitor for any sign of rash. Please taking Lamictal and contact office immediately rash develops. Recommend seeking urgent medical attention if rash is severe and/or spreading quickly.  ?Reviewed PDMP ?  ?Arlys John A. Tareek Sabo, NP ? ? ? ? ? ? ? ? ?Please see After Visit Summary for patient specific instructions. ? ?No future appointments. ? ?No orders of the defined types were placed in this encounter. ? ? ?  ?-------------------------------  ?

## 2021-09-25 ENCOUNTER — Other Ambulatory Visit: Payer: Self-pay | Admitting: Behavioral Health

## 2021-09-25 DIAGNOSIS — F331 Major depressive disorder, recurrent, moderate: Secondary | ICD-10-CM

## 2021-09-25 DIAGNOSIS — F411 Generalized anxiety disorder: Secondary | ICD-10-CM

## 2021-09-25 DIAGNOSIS — F39 Unspecified mood [affective] disorder: Secondary | ICD-10-CM

## 2021-09-28 ENCOUNTER — Other Ambulatory Visit: Payer: Self-pay | Admitting: Behavioral Health

## 2021-09-28 DIAGNOSIS — F411 Generalized anxiety disorder: Secondary | ICD-10-CM

## 2021-09-28 DIAGNOSIS — F331 Major depressive disorder, recurrent, moderate: Secondary | ICD-10-CM

## 2021-09-28 DIAGNOSIS — F39 Unspecified mood [affective] disorder: Secondary | ICD-10-CM

## 2021-09-30 NOTE — Telephone Encounter (Signed)
Please schedule appt

## 2021-10-01 NOTE — Telephone Encounter (Signed)
Lvm for pt to call and schedule

## 2021-12-12 ENCOUNTER — Other Ambulatory Visit: Payer: Self-pay | Admitting: Behavioral Health

## 2021-12-12 DIAGNOSIS — F411 Generalized anxiety disorder: Secondary | ICD-10-CM

## 2021-12-12 DIAGNOSIS — F331 Major depressive disorder, recurrent, moderate: Secondary | ICD-10-CM

## 2021-12-12 DIAGNOSIS — F39 Unspecified mood [affective] disorder: Secondary | ICD-10-CM

## 2021-12-12 NOTE — Telephone Encounter (Signed)
Please call patient to schedule appt, was due in June.

## 2021-12-13 NOTE — Telephone Encounter (Signed)
Patient scheduled 9/5

## 2021-12-24 ENCOUNTER — Encounter: Payer: Self-pay | Admitting: Behavioral Health

## 2021-12-24 ENCOUNTER — Telehealth (INDEPENDENT_AMBULATORY_CARE_PROVIDER_SITE_OTHER): Payer: BC Managed Care – PPO | Admitting: Behavioral Health

## 2021-12-24 DIAGNOSIS — F331 Major depressive disorder, recurrent, moderate: Secondary | ICD-10-CM

## 2021-12-24 DIAGNOSIS — F411 Generalized anxiety disorder: Secondary | ICD-10-CM

## 2021-12-24 NOTE — Progress Notes (Signed)
Ivan Ortiz [id number] 1985/09/23 36 y.o.  Virtual Visit via Video Note  I connected with pt @ on 12/24/21 at  2:30 PM EDT by a video enabled telemedicine application and verified that I am speaking with the correct person using two identifiers.   I discussed the limitations of evaluation and management by telemedicine and the availability of in person appointments. The patient expressed understanding and agreed to proceed.  I discussed the assessment and treatment plan with the patient. The patient was provided an opportunity to ask questions and all were answered. The patient agreed with the plan and demonstrated an understanding of the instructions.   The patient was advised to call back or seek an in-person evaluation if the symptoms worsen or if the condition fails to improve as anticipated.  I provided 30 minutes of non-face-to-face time during this encounter.  The patient was located at home.  The provider was located at [facility].   [name], NP   Subjective:   Patient ID:  Ivan Ortiz is a 36 y.o. (DOB Jan 28, 1986) male.  Chief Complaint:  Chief Complaint  Patient presents with   Anxiety   Depression   Follow-up   Medication Refill   Patient Education    HPI  36  year old male presents to this office  via video visit for follow up and medication management. He says he feels like his medications have helped to a large degree. Says he was hoping Wellbutrin would have a more significant effect since he went up to 450 mg daily. He is concerned about experiencing some hair loss and being related to Wellbutrin. He is also concerned about his productivity and focus at work. He would like to observe over the next couple of months and consider weaning off the Wellbutrin and maybe trying another medication. Reports he and his wife are doing so much better and marriage has improved with counseling.  He reports his anxiety today at 2/10 and depression at 3/10.   He would like to continue to f/u every two months for now.  He denies mania, no psychosis. No SI/HI.     Past psychiatric medication trials Lexapro Wellbutrin   Review of Systems:  Review of Systems  Medications: I have reviewed the patient's current medications.  Current Outpatient Medications  Medication Sig Dispense Refill   buPROPion (WELLBUTRIN XL) 150 MG 24 hr tablet TAKE 2 TABS DAILY 60 tablet 3   buPROPion (WELLBUTRIN XL) 150 MG 24 hr tablet Take 3 tablets (450 mg total) by mouth daily. 270 tablet 1   lamoTRIgine (LAMICTAL) 100 MG tablet TAKE 2 TABLETS BY MOUTH DAILY 180 tablet 0   No current facility-administered medications for this visit.    Medication Side Effects: None  Allergies: No Known Allergies  Past Medical History:  Diagnosis Date   RBBB     No family history on file.  Social History   Socioeconomic History   Marital status: Married    Spouse name: [name]   Number of children: 4   Years of education: 16   Highest education level: Bachelor's degree (e.g., BA, AB, BS)  Occupational History   Occupation: Patent examiner  Tobacco Use   Smoking status: Every Day    Packs/day: 0.50    Types: Cigarettes   Smokeless tobacco: Never  Substance and Sexual Activity   Alcohol use: Yes    Comment: occ   Drug use: Not on file   Sexual activity: Yes  Comment: Wife  Other Topics Concern   Not on file  Social History Narrative   Lives with wife and 4 children in [place].   Social Determinants of Health   Financial Resource Strain: Not on file  Food Insecurity: Not on file  Transportation Needs: Not on file  Physical Activity: Not on file  Stress: Not on file  Social Connections: Not on file  Intimate Partner Violence: Not on file    Past Medical History, Surgical history, Social history, and Family history were reviewed and updated as appropriate.   Please see review of systems for further details on the patient's review from today.    Objective:   Physical Exam:  There were no vitals taken for this visit.  Physical Exam  Lab Review:  No results found for: "NA", "K", "CL", "CO2", "GLUCOSE", "BUN", "CREATININE", "CALCIUM", "PROT", "ALBUMIN", "AST", "ALT", "ALKPHOS", "BILITOT", "GFRNONAA", "GFRAA"     Component Value Date/Time   WBC 5.7 10/26/2015 1015   RBC 5.46 10/26/2015 1015   HGB 17.4 10/26/2015 1015   HCT 49.3 10/26/2015 1015   MCV 90.1 10/26/2015 1015   MCH 31.9 (A) 10/26/2015 1015   MCHC 35.4 10/26/2015 1015    No results found for: "POCLITH", "LITHIUM"   No results found for: "PHENYTOIN", "PHENOBARB", "VALPROATE", "CBMZ"   .res Assessment: Plan:      Continue Wellbutrin to 450 mg XL daily. To continue Lamictal to 200 mg daily. Will report any side effects or worsening symptoms of depression promptly. To follow up in 8 weeks to reassess.  Greater than 50% of  20  min. face to face video visit was spent on counseling and coordination of care. Discussed his current level of stability. He felt like Wellbutrin would have more of an impact but he is coping.  He agrees to discuss other medication options next visit if not significantly improved. He is reporting some hair loss and believes it may be related to Wellbutrin.  He will schedule in person visit and discuss possible medication changes.   Monitor for any sign of rash. Please taking Lamictal and contact office immediately rash develops. Recommend seeking urgent medical attention if rash is severe and/or spreading quickly.  Reviewed PDMP   Watt Climes. Alayzha An, NP    There are no diagnoses linked to this encounter.   Please see After Visit Summary for patient specific instructions.  No future appointments.  No orders of the defined types were placed in this encounter.     -------------------------------

## 2022-01-23 ENCOUNTER — Other Ambulatory Visit: Payer: Self-pay | Admitting: Behavioral Health

## 2022-01-23 DIAGNOSIS — F331 Major depressive disorder, recurrent, moderate: Secondary | ICD-10-CM

## 2022-01-23 DIAGNOSIS — F39 Unspecified mood [affective] disorder: Secondary | ICD-10-CM

## 2022-01-23 DIAGNOSIS — F411 Generalized anxiety disorder: Secondary | ICD-10-CM

## 2022-02-24 ENCOUNTER — Ambulatory Visit: Payer: BC Managed Care – PPO | Admitting: Behavioral Health

## 2022-02-24 NOTE — Progress Notes (Signed)
Pt left message concerning work conflict and not being able to make appt. No charges will be assessed.

## 2022-02-28 ENCOUNTER — Telehealth: Payer: BC Managed Care – PPO | Admitting: Behavioral Health

## 2022-03-06 ENCOUNTER — Encounter: Payer: Self-pay | Admitting: Behavioral Health

## 2022-03-06 ENCOUNTER — Telehealth (INDEPENDENT_AMBULATORY_CARE_PROVIDER_SITE_OTHER): Payer: BC Managed Care – PPO | Admitting: Behavioral Health

## 2022-03-06 DIAGNOSIS — F331 Major depressive disorder, recurrent, moderate: Secondary | ICD-10-CM | POA: Diagnosis not present

## 2022-03-06 DIAGNOSIS — F411 Generalized anxiety disorder: Secondary | ICD-10-CM | POA: Diagnosis not present

## 2022-03-06 DIAGNOSIS — F900 Attention-deficit hyperactivity disorder, predominantly inattentive type: Secondary | ICD-10-CM | POA: Diagnosis not present

## 2022-03-06 MED ORDER — AMPHETAMINE-DEXTROAMPHET ER 10 MG PO CP24: 10.0000 mg | ORAL_CAPSULE | Freq: Every day | ORAL | 0 refills | Status: DC

## 2022-03-06 NOTE — Progress Notes (Signed)
Ivan Ortiz [id number] 1985/10/03 36 y.o.  Virtual Visit via Video Note  I connected with pt @ on 03/06/22 at  3:30 PM EST by a video enabled telemedicine application and verified that I am speaking with the correct person using two identifiers.   I discussed the limitations of evaluation and management by telemedicine and the availability of in person appointments. The patient expressed understanding and agreed to proceed.  I discussed the assessment and treatment plan with the patient. The patient was provided an opportunity to ask questions and all were answered. The patient agreed with the plan and demonstrated an understanding of the instructions.   The patient was advised to call back or seek an in-person evaluation if the symptoms worsen or if the condition fails to improve as anticipated.  I provided 40 minutes of non-face-to-face time during this encounter.  The patient was located at home.  The provider was located at [facility].   [name], NP   Subjective:   Patient ID:  Ivan Ortiz is a 36 y.o. (DOB [birth year]) male.  Chief Complaint:  Chief Complaint  Patient presents with   ADHD   Follow-up   Medication Refill   Medication Problem   Patient Education    HPI 36  year old male presents to this office  via video visit for follow up and medication management. Says that medication have some positive effect over the last year but he is still struggling with attention and focus. He feels like his performance at work is not what it should be. With some anxiety and depression in the past, he was hoping that Wellbutrin would help along with attention problems. Says that looking back he can count on one hand the number of books he has been able to complete. Says he gets paralyzed on projects that do not interest him and has been a extreme procrastinator. He endorses staying on task and avoiding things that do not interest him. He gets easily distracted  by activity and noise around him when working. Says he was hoping Wellbutrin would have a more significant effect since he went up to 450 mg daily. He is interested in being more evaluated for ADHD at this point.  Reports he and his wife are doing so much better and marriage has improved with counseling.  He reports his anxiety today at 2/10 and depression at 2/10.  He denies mania, no psychosis. No SI/HI.     Past psychiatric medication trials Lexapro Wellbutrin   Review of Systems:  Review of Systems  Constitutional: Negative.   Allergic/Immunologic: Negative.   Neurological: Negative.   Psychiatric/Behavioral:  Positive for decreased concentration.     Medications: I have reviewed the patient's current medications.  Current Outpatient Medications  Medication Sig Dispense Refill   buPROPion (WELLBUTRIN XL) 150 MG 24 hr tablet TAKE 2 TABS DAILY 60 tablet 3   buPROPion (WELLBUTRIN XL) 150 MG 24 hr tablet TAKE 3 TABLETS BY MOUTH DAILY 270 tablet 0   lamoTRIgine (LAMICTAL) 100 MG tablet TAKE 2 TABLETS BY MOUTH DAILY 180 tablet 0   No current facility-administered medications for this visit.    Medication Side Effects: None  Allergies: No Known Allergies  Past Medical History:  Diagnosis Date   RBBB     No family history on file.  Social History   Socioeconomic History   Marital status: Married    Spouse name: [name]   Number of children: 4   Years of education:  16   Highest education level: Bachelor's degree (e.g., BA, AB, BS)  Occupational History   Occupation: Patent examiner  Tobacco Use   Smoking status: Every Day    Packs/day: 0.50    Types: Cigarettes   Smokeless tobacco: Never  Substance and Sexual Activity   Alcohol use: Yes    Comment: occ   Drug use: Not on file   Sexual activity: Yes    Comment: Wife  Other Topics Concern   Not on file  Social History Narrative   Lives with wife and 4 children in [place].   Social Determinants of Health    Financial Resource Strain: Not on file  Food Insecurity: Not on file  Transportation Needs: Not on file  Physical Activity: Not on file  Stress: Not on file  Social Connections: Not on file  Intimate Partner Violence: Not on file    Past Medical History, Surgical history, Social history, and Family history were reviewed and updated as appropriate.   Please see review of systems for further details on the patient's review from today.   Objective:   Physical Exam:  There were no vitals taken for this visit.  Physical Exam Constitutional:      General: He is not in acute distress.    Appearance: Normal appearance.  Neurological:     Mental Status: He is alert and oriented to person, place, and time.     Gait: Gait normal.  Psychiatric:        Attention and Perception: Attention and perception normal. He does not perceive auditory or visual hallucinations.        Mood and Affect: Mood and affect normal. Mood is not anxious or depressed. Affect is not labile.        Speech: Speech normal.        Behavior: Behavior normal. Behavior is cooperative.        Thought Content: Thought content normal.        Cognition and Memory: Cognition and memory normal.        Judgment: Judgment normal.     Lab Review:  No results found for: "NA", "K", "CL", "CO2", "GLUCOSE", "BUN", "CREATININE", "CALCIUM", "PROT", "ALBUMIN", "AST", "ALT", "ALKPHOS", "BILITOT", "GFRNONAA", "GFRAA"     Component Value Date/Time   WBC 5.7 10/26/2015 1015   RBC 5.46 10/26/2015 1015   HGB 17.4 10/26/2015 1015   HCT 49.3 10/26/2015 1015   MCV 90.1 10/26/2015 1015   MCH 31.9 (A) 10/26/2015 1015   MCHC 35.4 10/26/2015 1015    No results found for: "POCLITH", "LITHIUM"   No results found for: "PHENYTOIN", "PHENOBARB", "VALPROATE", "CBMZ"   .res Assessment: Plan:    Reduce Wellbutrin to 300 mg XL daily. To continue Lamictal to 200 mg daily. Will start Adderall 10 mg ER daily in am after breakfast Will  report any side effects or worsening symptoms of depression promptly. To follow up in 4weeks to reassess.  Greater than 50% of  40  min. face to face video visit was spent on counseling and coordination of care. Discussed his current level of stability. He felt like Wellbutrin would have more of an impact but he is coping.  We assessed for ADHD today. Conducted the Adult Self Report Questionnaire. Scores: Part A=26 Part B=19 These scores suggest presence of ADHD combined type. More testing is recommended at later date. He agrees to continue to follow up and participate in computerized testing when available.  No hx of addiction problems or  increased risk for abuse.  Discussed potential benefits, risks, and side effects of stimulants with patient to include increased heart rate, palpitations, insomnia, increased anxiety, increased irritability, or decreased appetite.  Instructed patient to contact office if experiencing any significant tolerability issues.  Monitor for any sign of rash. Please taking Lamictal and contact office immediately rash develops. Recommend seeking urgent medical attention if rash is severe and/or spreading quickly.  Reviewed PDMP   Watt Climes. Alyxandria Wentz, NP     Dayn was seen today for adhd, follow-up, medication refill, medication problem and patient education.  Diagnoses and all orders for this visit:  Major depressive disorder, recurrent episode, moderate (HCC)  Generalized anxiety disorder  Attention deficit hyperactivity disorder (ADHD), predominantly inattentive type     Please see After Visit Summary for patient specific instructions.  No future appointments.  No orders of the defined types were placed in this encounter.     -------------------------------

## 2022-03-25 ENCOUNTER — Other Ambulatory Visit: Payer: Self-pay | Admitting: Behavioral Health

## 2022-03-25 DIAGNOSIS — F39 Unspecified mood [affective] disorder: Secondary | ICD-10-CM

## 2022-03-25 DIAGNOSIS — F411 Generalized anxiety disorder: Secondary | ICD-10-CM

## 2022-03-25 DIAGNOSIS — F331 Major depressive disorder, recurrent, moderate: Secondary | ICD-10-CM

## 2022-04-04 ENCOUNTER — Telehealth (INDEPENDENT_AMBULATORY_CARE_PROVIDER_SITE_OTHER): Payer: BC Managed Care – PPO | Admitting: Behavioral Health

## 2022-04-04 ENCOUNTER — Encounter: Payer: Self-pay | Admitting: Behavioral Health

## 2022-04-04 DIAGNOSIS — F900 Attention-deficit hyperactivity disorder, predominantly inattentive type: Secondary | ICD-10-CM | POA: Diagnosis not present

## 2022-04-04 DIAGNOSIS — F39 Unspecified mood [affective] disorder: Secondary | ICD-10-CM

## 2022-04-04 DIAGNOSIS — F331 Major depressive disorder, recurrent, moderate: Secondary | ICD-10-CM | POA: Diagnosis not present

## 2022-04-04 DIAGNOSIS — F411 Generalized anxiety disorder: Secondary | ICD-10-CM | POA: Diagnosis not present

## 2022-04-04 MED ORDER — LAMOTRIGINE 100 MG PO TABS: 200.0000 mg | ORAL_TABLET | Freq: Every day | ORAL | 0 refills | Status: DC

## 2022-04-04 MED ORDER — BUPROPION HCL ER (XL) 150 MG PO TB24: ORAL_TABLET | ORAL | 3 refills | Status: DC

## 2022-04-04 MED ORDER — AMPHETAMINE-DEXTROAMPHET ER 20 MG PO CP24: 20.0000 mg | ORAL_CAPSULE | Freq: Every day | ORAL | 0 refills | Status: DC

## 2022-04-04 NOTE — Progress Notes (Signed)
Ivan Ortiz [id number] 09-02-1985 36 y.o.  Virtual Visit via Video Note  I connected with pt @ on 04/04/22 at  3:30 PM EST by a video enabled telemedicine application and verified that I am speaking with the correct person using two identifiers.   I discussed the limitations of evaluation and management by telemedicine and the availability of in person appointments. The patient expressed understanding and agreed to proceed.  I discussed the assessment and treatment plan with the patient. The patient was provided an opportunity to ask questions and all were answered. The patient agreed with the plan and demonstrated an understanding of the instructions.   The patient was advised to call back or seek an in-person evaluation if the symptoms worsen or if the condition fails to improve as anticipated.  I provided 30 minutes of non-face-to-face time during this encounter.  The patient was located at home.  The provider was located at [facility].   Ivan Flores, NP   Subjective:   Patient ID:  [id number] Ivan Ortiz is a 36 y.o. (DOB Nov 28, 1985) male.  Chief Complaint:  Chief Complaint  Patient presents with   Anxiety   Depression   Medication Refill   Follow-up   Patient Education   ADHD    HPI  37  year old male presents to this office  via video visit for follow up and medication management. Says that he has experienced minimal improvement since starting the Adderall. Says he is not sure he can tell a difference with attention or focus. No change in in success of completing task or motivation. Requesting consideration in adjusting medication at this time. He would like to continue to wean from Wellbutrin and Lamictal over the next few months. Reports he and his wife are doing so much better and marriage has improved with counseling.  He reports his anxiety today at 2/10 and depression at 2/10.  He denies mania, no psychosis. No SI/HI.     Past psychiatric medication  trials Lexapro Wellbutrin   Review of Systems:  Review of Systems  Constitutional: Negative.   Allergic/Immunologic: Negative.   Psychiatric/Behavioral:  Positive for dysphoric mood. The patient is nervous/anxious.     Medications: I have reviewed the patient's current medications.  Current Outpatient Medications  Medication Sig Dispense Refill   amphetamine-dextroamphetamine (ADDERALL XR) 20 MG 24 hr capsule Take 1 capsule (20 mg total) by mouth daily. 30 capsule 0   amphetamine-dextroamphetamine (ADDERALL XR) 10 MG 24 hr capsule Take 1 capsule (10 mg total) by mouth daily. 30 capsule 0   buPROPion (WELLBUTRIN XL) 150 MG 24 hr tablet TAKE 3 TABLETS BY MOUTH DAILY 270 tablet 0   buPROPion (WELLBUTRIN XL) 150 MG 24 hr tablet TAKE 2 TABS DAILY 60 tablet 3   lamoTRIgine (LAMICTAL) 100 MG tablet Take 2 tablets (200 mg total) by mouth daily. 180 tablet 0   No current facility-administered medications for this visit.    Medication Side Effects: None  Allergies: No Known Allergies  Past Medical History:  Diagnosis Date   RBBB     No family history on file.  Social History   Socioeconomic History   Marital status: Married    Spouse name: [name]   Number of children: 4   Years of education: 16   Highest education level: Bachelor's degree (e.g., BA, AB, BS)  Occupational History   Occupation: Patent examiner  Tobacco Use   Smoking status: Every Day    Packs/day: 0.50  Types: Cigarettes   Smokeless tobacco: Never  Substance and Sexual Activity   Alcohol use: Yes    Comment: occ   Drug use: Not on file   Sexual activity: Yes    Comment: Wife  Other Topics Concern   Not on file  Social History Narrative   Lives with wife and 4 children in [place].   Social Determinants of Health   Financial Resource Strain: Not on file  Food Insecurity: Not on file  Transportation Needs: Not on file  Physical Activity: Not on file  Stress: Not on file  Social  Connections: Not on file  Intimate Partner Violence: Not on file    Past Medical History, Surgical history, Social history, and Family history were reviewed and updated as appropriate.   Please see review of systems for further details on the patient's review from today.   Objective:   Physical Exam:  There were no vitals taken for this visit.  Physical Exam Constitutional:      General: He is not in acute distress.    Appearance: Normal appearance.  Neurological:     Mental Status: He is alert and oriented to person, place, and time.     Gait: Gait normal.  Psychiatric:        Attention and Perception: Attention and perception normal. He does not perceive auditory or visual hallucinations.        Mood and Affect: Mood and affect normal. Mood is not anxious or depressed. Affect is not labile.        Speech: Speech normal.        Behavior: Behavior normal. Behavior is cooperative.        Thought Content: Thought content normal.        Cognition and Memory: Cognition and memory normal.        Judgment: Judgment normal.     Lab Review:  No results found for: "NA", "K", "CL", "CO2", "GLUCOSE", "BUN", "CREATININE", "CALCIUM", "PROT", "ALBUMIN", "AST", "ALT", "ALKPHOS", "BILITOT", "GFRNONAA", "GFRAA"     Component Value Date/Time   WBC 5.7 10/26/2015 1015   RBC 5.46 10/26/2015 1015   HGB 17.4 10/26/2015 1015   HCT 49.3 10/26/2015 1015   MCV 90.1 10/26/2015 1015   MCH 31.9 (A) 10/26/2015 1015   MCHC 35.4 10/26/2015 1015    No results found for: "POCLITH", "LITHIUM"   No results found for: "PHENYTOIN", "PHENOBARB", "VALPROATE", "CBMZ"   .res Assessment: Plan:    Greater than 50% of  30  min. face to face video visit was spent on counseling and coordination of care. Discussed his current level of stability. We discussed his limited improvement with attention and focus or ability to complete task with Adderall 10 mg.   We agreed to: Continue  Wellbutrin to 300 mg XL  daily. To continue Lamictal to 200 mg daily. To increase Adderall 20 mg ER daily in am after breakfast Will report any side effects or worsening symptoms of depression promptly. Provided emergency contact information To follow up in 8 weeks to reassess.   Discussed potential benefits, risks, and side effects of stimulants with patient to include increased heart rate, palpitations, insomnia, increased anxiety, increased irritability, or decreased appetite.  Instructed patient to contact office if experiencing any significant tolerability issues.  Monitor for any sign of rash. Please taking Lamictal and contact office immediately rash develops. Recommend seeking urgent medical attention if rash is severe and/or spreading quickly.  Reviewed PDMP   Shadee was seen today for  anxiety, depression, medication refill, follow-up, patient education and adhd.  Diagnoses and all orders for this visit:  Attention deficit hyperactivity disorder (ADHD), predominantly inattentive type -     Discontinue: amphetamine-dextroamphetamine (ADDERALL XR) 20 MG 24 hr capsule; Take 1 capsule (20 mg total) by mouth daily. -     amphetamine-dextroamphetamine (ADDERALL XR) 20 MG 24 hr capsule; Take 1 capsule (20 mg total) by mouth daily.  Major depressive disorder, recurrent episode, moderate (HCC) -     lamoTRIgine (LAMICTAL) 100 MG tablet; Take 2 tablets (200 mg total) by mouth daily. -     buPROPion (WELLBUTRIN XL) 150 MG 24 hr tablet; TAKE 2 TABS DAILY  Generalized anxiety disorder -     lamoTRIgine (LAMICTAL) 100 MG tablet; Take 2 tablets (200 mg total) by mouth daily. -     buPROPion (WELLBUTRIN XL) 150 MG 24 hr tablet; TAKE 2 TABS DAILY  Unspecified mood (affective) disorder (HCC) -     lamoTRIgine (LAMICTAL) 100 MG tablet; Take 2 tablets (200 mg total) by mouth daily.     Please see After Visit Summary for patient specific instructions.  Future Appointments  Date Time Provider Department Center   05/30/2022  3:00 PM Avelina Laine A, NP [facility] None    No orders of the defined types were placed in this encounter.     -------------------------------

## 2022-05-30 ENCOUNTER — Encounter: Payer: Self-pay | Admitting: Behavioral Health

## 2022-05-30 ENCOUNTER — Telehealth (INDEPENDENT_AMBULATORY_CARE_PROVIDER_SITE_OTHER): Payer: 59 | Admitting: Behavioral Health

## 2022-05-30 DIAGNOSIS — F331 Major depressive disorder, recurrent, moderate: Secondary | ICD-10-CM

## 2022-05-30 DIAGNOSIS — F330 Major depressive disorder, recurrent, mild: Secondary | ICD-10-CM

## 2022-05-30 DIAGNOSIS — F419 Anxiety disorder, unspecified: Secondary | ICD-10-CM | POA: Diagnosis not present

## 2022-05-30 DIAGNOSIS — F909 Attention-deficit hyperactivity disorder, unspecified type: Secondary | ICD-10-CM | POA: Diagnosis not present

## 2022-05-30 DIAGNOSIS — F411 Generalized anxiety disorder: Secondary | ICD-10-CM

## 2022-05-30 DIAGNOSIS — F32A Depression, unspecified: Secondary | ICD-10-CM | POA: Diagnosis not present

## 2022-05-30 DIAGNOSIS — F900 Attention-deficit hyperactivity disorder, predominantly inattentive type: Secondary | ICD-10-CM

## 2022-05-30 DIAGNOSIS — F39 Unspecified mood [affective] disorder: Secondary | ICD-10-CM

## 2022-05-30 MED ORDER — BUPROPION HCL ER (XL) 150 MG PO TB24: ORAL_TABLET | ORAL | 2 refills | Status: DC

## 2022-05-30 MED ORDER — AMPHETAMINE-DEXTROAMPHET ER 30 MG PO CP24: 30.0000 mg | ORAL_CAPSULE | Freq: Every day | ORAL | 0 refills | Status: DC

## 2022-05-30 MED ORDER — LAMOTRIGINE 100 MG PO TABS: 200.0000 mg | ORAL_TABLET | Freq: Every day | ORAL | 1 refills | Status: DC

## 2022-05-30 NOTE — Progress Notes (Signed)
ADDIE SISTARE [id number] [birth year] 37 y.o.  Virtual Visit via Video Note  I connected with pt @ on 05/30/22 at  3:00 PM EST by a video enabled telemedicine application and verified that I am speaking with the correct person using two identifiers.   I discussed the limitations of evaluation and management by telemedicine and the availability of in person appointments. The patient expressed understanding and agreed to proceed.  I discussed the assessment and treatment plan with the patient. The patient was provided an opportunity to ask questions and all were answered. The patient agreed with the plan and demonstrated an understanding of the instructions.   The patient was advised to call back or seek an in-person evaluation if the symptoms worsen or if the condition fails to improve as anticipated.  I provided 20 minutes of non-face-to-face time during this encounter.  The patient was located at home.  The provider was located at [facility].   Elwanda Brooklyn, NP   Subjective:   Patient ID:  Ivan Ortiz is a 37 y.o. (DOB [birth year]) male.  Chief Complaint:  Chief Complaint  Patient presents with   ADHD   Follow-up   Medication Problem   Medication Refill   Patient Education   Depression   Anxiety    HPI  37 year old male presents to this office  via video visit for follow up and medication management. Says that he has experienced minimal improvement since starting the Adderall. He feels like it has helped in some aspects but not with productivity with work. He would like to consider another dosage increase to see if  we can get to a dosage that may help. He would like to continue to wean from Wellbutrin and Lamictal over the next few months. Reports he and his wife are doing so much better and marriage has improved with counseling.  He reports his anxiety today at 2/10 and depression at 2/10.  He denies mania, no psychosis. No SI/HI.     Past psychiatric  medication trials Lexapro Wellbutrin    Review of Systems:  Review of Systems  Constitutional: Negative.   Allergic/Immunologic: Negative.   Neurological: Negative.   Psychiatric/Behavioral: Negative.      Medications: I have reviewed the patient's current medications.  Current Outpatient Medications  Medication Sig Dispense Refill   amphetamine-dextroamphetamine (ADDERALL XR) 10 MG 24 hr capsule Take 1 capsule (10 mg total) by mouth daily. 30 capsule 0   amphetamine-dextroamphetamine (ADDERALL XR) 20 MG 24 hr capsule Take 1 capsule (20 mg total) by mouth daily. 30 capsule 0   buPROPion (WELLBUTRIN XL) 150 MG 24 hr tablet TAKE 3 TABLETS BY MOUTH DAILY 270 tablet 0   buPROPion (WELLBUTRIN XL) 150 MG 24 hr tablet TAKE 2 TABS DAILY 60 tablet 3   lamoTRIgine (LAMICTAL) 100 MG tablet Take 2 tablets (200 mg total) by mouth daily. 180 tablet 0   No current facility-administered medications for this visit.    Medication Side Effects: None  Allergies: No Known Allergies  Past Medical History:  Diagnosis Date   RBBB     History reviewed. No pertinent family history.  Social History   Socioeconomic History   Marital status: Married    Spouse name: [name]   Number of children: 4   Years of education: 16   Highest education level: Bachelor's degree (e.g., BA, AB, BS)  Occupational History   Occupation: Air cabin crew  Tobacco Use   Smoking status: Every Day  Packs/day: 0.50    Types: Cigarettes   Smokeless tobacco: Never  Substance and Sexual Activity   Alcohol use: Yes    Comment: occ   Drug use: Not on file   Sexual activity: Yes    Comment: Wife  Other Topics Concern   Not on file  Social History Narrative   Lives with wife and 4 children in [place].   Social Determinants of Health   Financial Resource Strain: Not on file  Food Insecurity: Not on file  Transportation Needs: Not on file  Physical Activity: Not on file  Stress: Not on file   Social Connections: Not on file  Intimate Partner Violence: Not on file    Past Medical History, Surgical history, Social history, and Family history were reviewed and updated as appropriate.   Please see review of systems for further details on the patient's review from today.   Objective:   Physical Exam:  There were no vitals taken for this visit.  Physical Exam  Lab Review:  No results found for: "NA", "K", "CL", "CO2", "GLUCOSE", "BUN", "CREATININE", "CALCIUM", "PROT", "ALBUMIN", "AST", "ALT", "ALKPHOS", "BILITOT", "GFRNONAA", "GFRAA"     Component Value Date/Time   WBC 5.7 10/26/2015 1015   RBC 5.46 10/26/2015 1015   HGB 17.4 10/26/2015 1015   HCT 49.3 10/26/2015 1015   MCV 90.1 10/26/2015 1015   MCH 31.9 (A) 10/26/2015 1015   MCHC 35.4 10/26/2015 1015    No results found for: "POCLITH", "LITHIUM"   No results found for: "PHENYTOIN", "PHENOBARB", "VALPROATE", "CBMZ"   .res Assessment: Plan:    Greater than 50% of  30  min. face to face video visit was spent on counseling and coordination of care. Discussed his current level of stability. We discussed his limited improvement with attention and focus or ability to complete task with Adderall 20 mg ER. We agreed to: Continue  to reduce Wellbutrin to 150 mg XL daily. To continue Lamictal to 200 mg daily. To increase Adderall  to 30 mg ER daily in am after breakfast Will report any side effects or worsening symptoms of depression promptly. Provided emergency contact information To follow up in 12 weeks to reassess.    Discussed potential benefits, risks, and side effects of stimulants with patient to include increased heart rate, palpitations, insomnia, increased anxiety, increased irritability, or decreased appetite.  Instructed patient to contact office if experiencing any significant tolerability issues.  Monitor for any sign of rash. Please taking Lamictal and contact office immediately rash develops. Recommend  seeking urgent medical attention if rash is severe and/or spreading quickly.  Reviewed PDMP  There are no diagnoses linked to this encounter.   Please see After Visit Summary for patient specific instructions.  No future appointments.  No orders of the defined types were placed in this encounter.     -------------------------------

## 2022-07-03 ENCOUNTER — Other Ambulatory Visit: Payer: Self-pay | Admitting: Behavioral Health

## 2022-07-03 DIAGNOSIS — F331 Major depressive disorder, recurrent, moderate: Secondary | ICD-10-CM

## 2022-07-03 DIAGNOSIS — F411 Generalized anxiety disorder: Secondary | ICD-10-CM

## 2022-07-08 ENCOUNTER — Other Ambulatory Visit: Payer: Self-pay | Admitting: Behavioral Health

## 2022-07-08 DIAGNOSIS — F39 Unspecified mood [affective] disorder: Secondary | ICD-10-CM

## 2022-07-08 DIAGNOSIS — F411 Generalized anxiety disorder: Secondary | ICD-10-CM

## 2022-07-08 DIAGNOSIS — F331 Major depressive disorder, recurrent, moderate: Secondary | ICD-10-CM

## 2022-07-12 ENCOUNTER — Other Ambulatory Visit: Payer: Self-pay | Admitting: Behavioral Health

## 2022-07-12 DIAGNOSIS — F411 Generalized anxiety disorder: Secondary | ICD-10-CM

## 2022-07-12 DIAGNOSIS — F331 Major depressive disorder, recurrent, moderate: Secondary | ICD-10-CM

## 2022-08-22 ENCOUNTER — Telehealth (INDEPENDENT_AMBULATORY_CARE_PROVIDER_SITE_OTHER): Payer: 59 | Admitting: Behavioral Health

## 2022-08-22 ENCOUNTER — Encounter: Payer: Self-pay | Admitting: Behavioral Health

## 2022-08-22 DIAGNOSIS — F411 Generalized anxiety disorder: Secondary | ICD-10-CM

## 2022-08-22 DIAGNOSIS — F3342 Major depressive disorder, recurrent, in full remission: Secondary | ICD-10-CM

## 2022-08-22 NOTE — Progress Notes (Signed)
Ivan Ortiz [id number] October 30, 1985 37 y.o.  Virtual Visit via Video Note  I connected with pt @ on 08/22/22 at  3:30 PM EDT by a video enabled telemedicine application and verified that I am speaking with the correct person using two identifiers.   I discussed the limitations of evaluation and management by telemedicine and the availability of in person appointments. The patient expressed understanding and agreed to proceed.  I discussed the assessment and treatment plan with the patient. The patient was provided an opportunity to ask questions and all were answered. The patient agreed with the plan and demonstrated an understanding of the instructions.   The patient was advised to call back or seek an in-person evaluation if the symptoms worsen or if the condition fails to improve as anticipated.  I provided 20 minutes of non-face-to-face time during this encounter.  The patient was located at home.  The provider was located at [facility].   [name], NP   Subjective:   Patient ID:  Ivan Ortiz is a 37 y.o. (DOB 07/21/1985) male.  Chief Complaint: No chief complaint on file.   HPI  37 year old male presents to this office  via video visit for follow up and medication management.  Patient says that he has decided to stop taking all medications.  Says that he weaned off very slowly and is doing okay right now.  He feels that medications are just not the answer for him right now.  Says that he will call this office if anything changes or if he needs further psychiatric care.    Past psychiatric medication trials Lexapro Wellbutrin Lamictal    Review of Systems:  Review of Systems  Constitutional: Negative.   Allergic/Immunologic: Negative.   Neurological: Negative.   Psychiatric/Behavioral: Negative.      Medications: I have reviewed the patient's current medications.  Current Outpatient Medications  Medication Sig Dispense Refill    amphetamine-dextroamphetamine (ADDERALL XR) 10 MG 24 hr capsule Take 1 capsule (10 mg total) by mouth daily. 30 capsule 0   amphetamine-dextroamphetamine (ADDERALL XR) 20 MG 24 hr capsule Take 1 capsule (20 mg total) by mouth daily. 30 capsule 0   amphetamine-dextroamphetamine (ADDERALL XR) 30 MG 24 hr capsule Take 1 capsule (30 mg total) by mouth daily. 30 capsule 0   buPROPion (WELLBUTRIN XL) 150 MG 24 hr tablet TAKE 3 TABLETS BY MOUTH DAILY 270 tablet 0   buPROPion (WELLBUTRIN XL) 150 MG 24 hr tablet TAKE 1 TABLET BY MOUTH EVERY DAY 90 tablet 0   buPROPion (WELLBUTRIN XL) 150 MG 24 hr tablet TAKE 1 TABLETBY MOUTH DAILY 90 tablet 3   lamoTRIgine (LAMICTAL) 100 MG tablet TAKE 2 TABLETS BY MOUTH DAILY 180 tablet 3   No current facility-administered medications for this visit.    Medication Side Effects: None  Allergies: No Known Allergies  Past Medical History:  Diagnosis Date   RBBB     No family history on file.  Social History   Socioeconomic History   Marital status: Married    Spouse name: [name]   Number of children: 4   Years of education: 16   Highest education level: Bachelor's degree (e.g., BA, AB, BS)  Occupational History   Occupation: Patent examiner  Tobacco Use   Smoking status: Every Day    Packs/day: .5    Types: Cigarettes   Smokeless tobacco: Never  Substance and Sexual Activity   Alcohol use: Yes    Comment: occ  Drug use: Not on file   Sexual activity: Yes    Comment: Wife  Other Topics Concern   Not on file  Social History Narrative   Lives with wife and 4 children in [place].   Social Determinants of Health   Financial Resource Strain: Not on file  Food Insecurity: Not on file  Transportation Needs: Not on file  Physical Activity: Not on file  Stress: Not on file  Social Connections: Not on file  Intimate Partner Violence: Not on file    Past Medical History, Surgical history, Social history, and Family history were reviewed  and updated as appropriate.   Please see review of systems for further details on the patient's review from today.   Objective:   Physical Exam:  There were no vitals taken for this visit.  Physical Exam Constitutional:      General: He is not in acute distress.    Appearance: Normal appearance.  Neurological:     Mental Status: He is alert and oriented to person, place, and time.     Gait: Gait normal.  Psychiatric:        Attention and Perception: Attention and perception normal. He does not perceive auditory or visual hallucinations.        Mood and Affect: Mood and affect normal. Mood is not anxious or depressed. Affect is not labile.        Speech: Speech normal.        Behavior: Behavior normal. Behavior is cooperative.        Thought Content: Thought content normal.        Cognition and Memory: Cognition and memory normal.        Judgment: Judgment normal.     Lab Review:  No results found for: "NA", "K", "CL", "CO2", "GLUCOSE", "BUN", "CREATININE", "CALCIUM", "PROT", "ALBUMIN", "AST", "ALT", "ALKPHOS", "BILITOT", "GFRNONAA", "GFRAA"     Component Value Date/Time   WBC 5.7 10/26/2015 1015   RBC 5.46 10/26/2015 1015   HGB 17.4 10/26/2015 1015   HCT 49.3 10/26/2015 1015   MCV 90.1 10/26/2015 1015   MCH 31.9 (A) 10/26/2015 1015   MCHC 35.4 10/26/2015 1015    No results found for: "POCLITH", "LITHIUM"   No results found for: "PHENYTOIN", "PHENOBARB", "VALPROATE", "CBMZ"   .res Assessment: Plan:    Greater than 50% of  30  min. face to face video visit was spent on counseling and coordination of care.  We discussed patient's decision to stop all medications.  He currently feels that this is not the direction that he would like to go and that he is not receiving any benefit from them.  He agrees to contact this office if his condition changes or if he further needs psychiatric care.   We agreed to: Pt stopped Wellbutrin to 150 mg XL daily. Stopped Lamictal to 200  mg daily. Stopped Adderall  to 30 mg ER daily in am after breakfast Will report any side effects or worsening symptoms of depression promptly. Provided emergency contact information Reviewed PDMP       There are no diagnoses linked to this encounter.   Please see After Visit Summary for patient specific instructions.  No future appointments.  No orders of the defined types were placed in this encounter.     -------------------------------

## 2023-01-02 ENCOUNTER — Ambulatory Visit: Payer: 59 | Admitting: Behavioral Health

## 2023-01-02 ENCOUNTER — Encounter: Payer: Self-pay | Admitting: Behavioral Health

## 2023-01-02 DIAGNOSIS — F39 Unspecified mood [affective] disorder: Secondary | ICD-10-CM | POA: Diagnosis not present

## 2023-01-02 DIAGNOSIS — F331 Major depressive disorder, recurrent, moderate: Secondary | ICD-10-CM | POA: Diagnosis not present

## 2023-01-02 DIAGNOSIS — F411 Generalized anxiety disorder: Secondary | ICD-10-CM

## 2023-01-02 MED ORDER — VILAZODONE HCL 20 MG PO TABS: 20.0000 mg | ORAL_TABLET | Freq: Every day | ORAL | 1 refills | Status: DC

## 2023-01-02 NOTE — Progress Notes (Signed)
[facility]  Patient ID: Ivan Ortiz,  MRN: [id number]  PCP: Alveria Apley, NP (Inactive)  Date of Evaluation: 01/02/2023 Time spent:30 minutes  Chief Complaint:  Chief Complaint   Depression; Anxiety; Follow-up; Medication Problem; Patient Education     HISTORY/CURRENT STATUS: HPI  37 year old male presents to this office  via video visit for follow up and medication management. Pt says that he stopped all medications as discussed last visit but he is noticing decline in his moods and depression levels have increased. He is wanting to try Viibryd as discussed in previous visit. He is wanting to stay away from AD that may cause sexual side effects.   Says that he will call this office if anything changes or if he needs further psychiatric care.    Past psychiatric medication trials Lexapro Wellbutrin Lamictal      Individual Medical History/ Review of Systems: Changes? :No   Allergies: Patient has no known allergies.  Current Medications:  Current Outpatient Medications:    Vilazodone HCl 20 MG TABS, Take 1 tablet (20 mg total) by mouth daily after breakfast., Disp: 30 tablet, Rfl: 1 Medication Side Effects: none  Family Medical/ Social History: Changes? No  MENTAL HEALTH EXAM:  There were no vitals taken for this visit.There is no height or weight on file to calculate BMI.  General Appearance: Casual, Neat, and Well Groomed  Eye Contact:  Good  Speech:  Clear and Coherent  Volume:  Normal  Mood:  Anxious, Depressed, and Dysphoric  Affect:  Depressed and Anxious  Thought Process:  Coherent  Orientation:  Full (Time, Place, and Person)  Thought Content: Logical   Suicidal Thoughts:  No  Homicidal Thoughts:  No  Memory:  WNL  Judgement:  Good  Insight:  Good  Psychomotor Activity:  Normal  Concentration:  Concentration: Good  Recall:  Good  Fund of Knowledge: Good  Language: Good  Assets:  Desire for Improvement  ADL's:  Intact   Cognition: WNL  Prognosis:  Good    DIAGNOSES:    ICD-10-CM   1. Major depressive disorder, recurrent episode, moderate (HCC)  F33.1 Vilazodone HCl 20 MG TABS    2. Generalized anxiety disorder  F41.1 Vilazodone HCl 20 MG TABS    3. Unspecified mood (affective) disorder (HCC)  F39 Vilazodone HCl 20 MG TABS      Receiving Psychotherapy: No    RECOMMENDATIONS:   Greater than 50% of  30  min. face to face video visit was spent on counseling and coordination of care.  We discussed patient's decision to stop all medications and his relapse with increased depression and negative moods. We discussed his desire for a trial of Viibryd this time to see if it will help.   We agreed to: Pt stopped Wellbutrin to 150 mg XL daily. Stopped Lamictal to 200 mg daily. To start Viibryd 10 mg for 7 days, then 20 mg daily Will report any side effects or worsening symptoms of depression promptly. Provided emergency contact information Reviewed PDMP    [name], NP

## 2023-02-09 ENCOUNTER — Telehealth (INDEPENDENT_AMBULATORY_CARE_PROVIDER_SITE_OTHER): Payer: 59 | Admitting: Behavioral Health

## 2023-02-09 ENCOUNTER — Encounter: Payer: Self-pay | Admitting: Behavioral Health

## 2023-02-09 DIAGNOSIS — F411 Generalized anxiety disorder: Secondary | ICD-10-CM

## 2023-02-09 DIAGNOSIS — F39 Unspecified mood [affective] disorder: Secondary | ICD-10-CM

## 2023-02-09 DIAGNOSIS — F331 Major depressive disorder, recurrent, moderate: Secondary | ICD-10-CM

## 2023-02-09 NOTE — Progress Notes (Signed)
Ivan Ortiz [id number] 1985/05/25 37 y.o.  Virtual Visit via Video Note  I connected with pt @ on 02/09/23 at 10:00 AM EDT by a video enabled telemedicine application and verified that I am speaking with the correct person using two identifiers.   I discussed the limitations of evaluation and management by telemedicine and the availability of in person appointments. The patient expressed understanding and agreed to proceed.  I discussed the assessment and treatment plan with the patient. The patient was provided an opportunity to ask questions and all were answered. The patient agreed with the plan and demonstrated an understanding of the instructions.   The patient was advised to call back or seek an in-person evaluation if the symptoms worsen or if the condition fails to improve as anticipated.  I provided 30 minutes of non-face-to-face time during this encounter.  The patient was located at home.  The provider was located at [facility].   [name], NP   Subjective:   Patient ID:  Ivan Ortiz is a 37 y.o. (DOB 07-15-1985) male.  Chief Complaint:  Chief Complaint  Patient presents with   Anxiety   Depression   Follow-up   Medication Refill   Patient Education   Medication Problem   Irritability    HPI 37 year old male presents to this office  via video visit for follow up and medication management. Pt says that his depression has improved since starting the Viibryd, but still staying irritable all the time. He does note increased life stressors such as work, raising young children and relationship challenges with wife. He is now in psychotherapy. He also reports some delayed ejaculation. He will continue current dose for 4 more weeks before reassessing.  Says that he will call this office if anything changes or if he needs further psychiatric care.    Past psychiatric medication trials Lexapro Wellbutrin Lamictal       Review of Systems:   Review of Systems  Constitutional: Negative.   Allergic/Immunologic: Negative.   Psychiatric/Behavioral:  Positive for dysphoric mood. The patient is nervous/anxious.     Medications: I have reviewed the patient's current medications.  Current Outpatient Medications  Medication Sig Dispense Refill   Vilazodone HCl 20 MG TABS Take 1 tablet (20 mg total) by mouth daily after breakfast. 30 tablet 1   No current facility-administered medications for this visit.    Medication Side Effects: None  Allergies: No Known Allergies  Past Medical History:  Diagnosis Date   RBBB     History reviewed. No pertinent family history.  Social History   Socioeconomic History   Marital status: Married    Spouse name: [name]   Number of children: 4   Years of education: 16   Highest education level: Bachelor's degree (e.g., BA, AB, BS)  Occupational History   Occupation: Patent examiner  Tobacco Use   Smoking status: Every Day    Current packs/day: 0.50    Types: Cigarettes   Smokeless tobacco: Never  Substance and Sexual Activity   Alcohol use: Yes    Comment: occ   Drug use: Not on file   Sexual activity: Yes    Comment: Wife  Other Topics Concern   Not on file  Social History Narrative   Lives with wife and 4 children in [place].   Social Determinants of Health   Financial Resource Strain: Not on file  Food Insecurity: Not on file  Transportation Needs: Not on file  Physical Activity: Not on file  Stress: Not on file  Social Connections: Not on file  Intimate Partner Violence: Not on file    Past Medical History, Surgical history, Social history, and Family history were reviewed and updated as appropriate.   Please see review of systems for further details on the patient's review from today.   Objective:   Physical Exam:  There were no vitals taken for this visit.  Physical Exam  Lab Review:  No results found for: "NA", "K", "CL", "CO2", "GLUCOSE",  "BUN", "CREATININE", "CALCIUM", "PROT", "ALBUMIN", "AST", "ALT", "ALKPHOS", "BILITOT", "GFRNONAA", "GFRAA"     Component Value Date/Time   WBC 5.7 10/26/2015 1015   RBC 5.46 10/26/2015 1015   HGB 17.4 10/26/2015 1015   HCT 49.3 10/26/2015 1015   MCV 90.1 10/26/2015 1015   MCH 31.9 (A) 10/26/2015 1015   MCHC 35.4 10/26/2015 1015    No results found for: "POCLITH", "LITHIUM"   No results found for: "PHENYTOIN", "PHENOBARB", "VALPROATE", "CBMZ"   .res Assessment: Plan:   Greater than 50% of  30  min. face to face video visit was spent on counseling and coordination of care.  Discussed his mild improvement with Viibryd but not where he wants to be yet. He agrees to continue for a few more weeks before considering adjustment.  We talked about his moods and frequent irritability, along with life stressors.    We agreed to: To continue Viibryd 20 mg daily. Must take with food.  Will report any side effects or worsening symptoms of depression promptly. To follow up in 4 weeks to reassess.  Provided emergency contact information Reviewed PDMP    [name], NP  Vy was seen today for anxiety, depression, follow-up, medication refill, patient education, medication problem and irritability.  Diagnoses and all orders for this visit:  Generalized anxiety disorder  Major depressive disorder, recurrent episode, moderate (HCC)  Unspecified mood (affective) disorder (HCC)     Please see After Visit Summary for patient specific instructions.  No future appointments.  No orders of the defined types were placed in this encounter.     -------------------------------

## 2023-03-12 ENCOUNTER — Telehealth (INDEPENDENT_AMBULATORY_CARE_PROVIDER_SITE_OTHER): Payer: 59 | Admitting: Behavioral Health

## 2023-03-12 ENCOUNTER — Encounter: Payer: Self-pay | Admitting: Behavioral Health

## 2023-03-12 DIAGNOSIS — F32A Depression, unspecified: Secondary | ICD-10-CM

## 2023-03-12 DIAGNOSIS — F411 Generalized anxiety disorder: Secondary | ICD-10-CM

## 2023-03-12 DIAGNOSIS — F331 Major depressive disorder, recurrent, moderate: Secondary | ICD-10-CM

## 2023-03-12 DIAGNOSIS — F39 Unspecified mood [affective] disorder: Secondary | ICD-10-CM

## 2023-03-12 DIAGNOSIS — F419 Anxiety disorder, unspecified: Secondary | ICD-10-CM | POA: Diagnosis not present

## 2023-03-12 NOTE — Progress Notes (Signed)
Ivan Ortiz [id number] March 24, 1986 37 y.o.  Virtual Visit via Video Note  I connected with pt @ on 03/12/23 at 11:00 AM EST by a video enabled telemedicine application and verified that I am speaking with the correct person using two identifiers.   I discussed the limitations of evaluation and management by telemedicine and the availability of in person appointments. The patient expressed understanding and agreed to proceed.  I discussed the assessment and treatment plan with the patient. The patient was provided an opportunity to ask questions and all were answered. The patient agreed with the plan and demonstrated an understanding of the instructions.   The patient was advised to call back or seek an in-person evaluation if the symptoms worsen or if the condition fails to improve as anticipated.  I provided 30 minutes of non-face-to-face time during this encounter.  The patient was located at home.  The provider was located at [facility].   [name], NP   Subjective:   Patient ID:  Ivan Ortiz is a 37 y.o. (DOB 06-Feb-1986) male.  Chief Complaint:  Chief Complaint  Patient presents with   Anxiety   Depression   Follow-up   Patient Education   Medication Refill    HPI  37 year old male presents to this office  via video visit for follow up and medication management. Pt says that his depression has improved since starting the Viibryd. Irritability has improved some. More stress at home. He and wife lost afternoon care for kids and that is causing logistical problems with work. He is now in psychotherapy. He also reports some delayed ejaculation. He will continue current dose for 4 more weeks before reassessing.  Says that he will call this office if anything changes or if he needs further psychiatric care.    Past psychiatric medication trials Lexapro Wellbutrin Lamictal    Review of Systems:  Review of Systems  Constitutional: Negative.    Allergic/Immunologic: Negative.   Psychiatric/Behavioral:  Positive for dysphoric mood. The patient is nervous/anxious.     Medications: I have reviewed the patient's current medications.  Current Outpatient Medications  Medication Sig Dispense Refill   Vilazodone HCl 20 MG TABS Take 1 tablet (20 mg total) by mouth daily after breakfast. 30 tablet 1   No current facility-administered medications for this visit.    Medication Side Effects: None  Allergies: No Known Allergies  Past Medical History:  Diagnosis Date   RBBB     History reviewed. No pertinent family history.  Social History   Socioeconomic History   Marital status: Married    Spouse name: [name]   Number of children: 4   Years of education: 16   Highest education level: Bachelor's degree (e.g., BA, AB, BS)  Occupational History   Occupation: Patent examiner  Tobacco Use   Smoking status: Every Day    Current packs/day: 0.50    Types: Cigarettes   Smokeless tobacco: Never  Substance and Sexual Activity   Alcohol use: Yes    Comment: occ   Drug use: Not on file   Sexual activity: Yes    Comment: Wife  Other Topics Concern   Not on file  Social History Narrative   Lives with wife and 4 children in [place].   Social Determinants of Health   Financial Resource Strain: Not on file  Food Insecurity: Not on file  Transportation Needs: Not on file  Physical Activity: Not on file  Stress: Not on  file  Social Connections: Not on file  Intimate Partner Violence: Not on file    Past Medical History, Surgical history, Social history, and Family history were reviewed and updated as appropriate.   Please see review of systems for further details on the patient's review from today.   Objective:   Physical Exam:  There were no vitals taken for this visit.  Physical Exam  Lab Review:  No results found for: "NA", "K", "CL", "CO2", "GLUCOSE", "BUN", "CREATININE", "CALCIUM", "PROT", "ALBUMIN",  "AST", "ALT", "ALKPHOS", "BILITOT", "GFRNONAA", "GFRAA"     Component Value Date/Time   WBC 5.7 10/26/2015 1015   RBC 5.46 10/26/2015 1015   HGB 17.4 10/26/2015 1015   HCT 49.3 10/26/2015 1015   MCV 90.1 10/26/2015 1015   MCH 31.9 (A) 10/26/2015 1015   MCHC 35.4 10/26/2015 1015    No results found for: "POCLITH", "LITHIUM"   No results found for: "PHENYTOIN", "PHENOBARB", "VALPROATE", "CBMZ"   .res Assessment: Plan:    Greater than 50% of  30  min. face to face video visit was spent on counseling and coordination of care.  Discussed his mild improvement with Viibryd but not where he wants to be yet. He is now open to increasing the medication.   We talked about his moods and frequent irritability, along with life stressors.    We agreed to: To increase Viibryd to 40 mg daily. Must take with food.  Will report any side effects or worsening symptoms of depression promptly. To follow up in 6 weeks to reassess.  Provided emergency contact information Reviewed PDMP  Arlys John A. Casmira Cramer, NP   There are no diagnoses linked to this encounter.   Please see After Visit Summary for patient specific instructions.  No future appointments.  No orders of the defined types were placed in this encounter.     -------------------------------

## 2023-03-15 ENCOUNTER — Other Ambulatory Visit: Payer: Self-pay | Admitting: Behavioral Health

## 2023-03-15 DIAGNOSIS — F331 Major depressive disorder, recurrent, moderate: Secondary | ICD-10-CM

## 2023-03-15 DIAGNOSIS — F39 Unspecified mood [affective] disorder: Secondary | ICD-10-CM

## 2023-03-15 DIAGNOSIS — F411 Generalized anxiety disorder: Secondary | ICD-10-CM

## 2023-03-15 MED ORDER — VILAZODONE HCL 40 MG PO TABS: 40.0000 mg | ORAL_TABLET | Freq: Every day | ORAL | 1 refills | Status: DC

## 2023-05-08 ENCOUNTER — Telehealth: Payer: 59 | Admitting: Behavioral Health

## 2023-06-05 ENCOUNTER — Other Ambulatory Visit: Payer: Self-pay | Admitting: Behavioral Health

## 2023-07-07 ENCOUNTER — Telehealth (INDEPENDENT_AMBULATORY_CARE_PROVIDER_SITE_OTHER): Admitting: Behavioral Health

## 2023-07-07 ENCOUNTER — Encounter: Payer: Self-pay | Admitting: Behavioral Health

## 2023-07-07 ENCOUNTER — Telehealth: Payer: Self-pay | Admitting: Behavioral Health

## 2023-07-07 DIAGNOSIS — F39 Unspecified mood [affective] disorder: Secondary | ICD-10-CM

## 2023-07-07 DIAGNOSIS — F411 Generalized anxiety disorder: Secondary | ICD-10-CM | POA: Diagnosis not present

## 2023-07-07 DIAGNOSIS — F331 Major depressive disorder, recurrent, moderate: Secondary | ICD-10-CM | POA: Diagnosis not present

## 2023-07-07 MED ORDER — VORTIOXETINE HBR 10 MG PO TABS: 10.0000 mg | ORAL_TABLET | Freq: Every day | ORAL | 1 refills | Status: DC

## 2023-07-07 NOTE — Progress Notes (Signed)
 Ivan Ortiz [id number] 03-Apr-1986 38 y.o.  Virtual Visit via Video Note  I connected with pt @ on 07/07/23 at  2:30 PM EDT by a video enabled telemedicine application and verified that I am speaking with the correct person using two identifiers.   I discussed the limitations of evaluation and management by telemedicine and the availability of in person appointments. The patient expressed understanding and agreed to proceed.  I discussed the assessment and treatment plan with the patient. The patient was provided an opportunity to ask questions and all were answered. The patient agreed with the plan and demonstrated an understanding of the instructions.   The patient was advised to call back or seek an in-person evaluation if the symptoms worsen or if the condition fails to improve as anticipated.  I provided 30 minutes of non-face-to-face time during this encounter.  The patient was located at home.  The provider was located at [facility].   [name], NP   Subjective:   Patient ID:  Ivan Ortiz is a 38 y.o. (DOB January 22, 1986) male.  Chief Complaint:  Chief Complaint  Patient presents with   Depression   Anxiety   Follow-up   Medication Refill   Patient Education   Medication Problem    HPI  38 year old male presents to this office via video visit for follow up and medication management.  Says he believes the Viibryd has never been that effective. Feeling more depression. He has questions about his medication and switching to another AD. Says he did not intend to go cold [place] but had logistic issue with getting his Viibryd and has been off the medication for one week. He would like to try Trintellix.  Has hesitancy in trying other AD's that cause sexual side effects or delayed ejaculation.  He is now in psychotherapy. Reports anxiety at 3/10 and depression at 5/10. Sleeping 7-8 hours per night. Denies mania, no psychosis, no auditory or visual  hallucinations. No SI or HI.     Past psychiatric medication trials Lexapro Wellbutrin Lamictal      Review of Systems:  Review of Systems  Constitutional: Negative.   Allergic/Immunologic: Negative.   Psychiatric/Behavioral:  Positive for dysphoric mood. The patient is nervous/anxious.     Medications: I have reviewed the patient's current medications.  Current Outpatient Medications  Medication Sig Dispense Refill   vortioxetine HBr (TRINTELLIX) 10 MG TABS tablet Take 1 tablet (10 mg total) by mouth daily. 30 tablet 1   Vilazodone HCl (VIIBRYD) 40 MG TABS TAKE 1 TABLET BY MOUTH EVERY DAY 30 tablet 1   No current facility-administered medications for this visit.    Medication Side Effects: None  Allergies: No Known Allergies  Past Medical History:  Diagnosis Date   RBBB     No family history on file.  Social History   Socioeconomic History   Marital status: Married    Spouse name: [name]   Number of children: 4   Years of education: 16   Highest education level: Bachelor's degree (e.g., BA, AB, BS)  Occupational History   Occupation: Patent examiner  Tobacco Use   Smoking status: Every Day    Current packs/day: 0.50    Types: Cigarettes   Smokeless tobacco: Never  Substance and Sexual Activity   Alcohol use: Yes    Comment: occ   Drug use: Not on file   Sexual activity: Yes    Comment: Wife  Other Topics Concern   Not  on file  Social History Narrative   Lives with wife and 4 children in [place].   Social Drivers of Corporate investment banker Strain: Not on file  Food Insecurity: Not on file  Transportation Needs: Not on file  Physical Activity: Not on file  Stress: Not on file  Social Connections: Not on file  Intimate Partner Violence: Not on file    Past Medical History, Surgical history, Social history, and Family history were reviewed and updated as appropriate.   Please see review of systems for further details on the  patient's review from today.   Objective:   Physical Exam:  There were no vitals taken for this visit.  Physical Exam Constitutional:      General: He is not in acute distress.    Appearance: Normal appearance.  Neurological:     Mental Status: He is alert and oriented to person, place, and time.     Gait: Gait normal.  Psychiatric:        Attention and Perception: Attention and perception normal. He does not perceive auditory or visual hallucinations.        Mood and Affect: Mood and affect normal. Mood is not anxious or depressed. Affect is not labile.        Speech: Speech normal.        Behavior: Behavior normal. Behavior is cooperative.        Thought Content: Thought content normal.        Cognition and Memory: Cognition and memory normal.        Judgment: Judgment normal.     Lab Review:  No results found for: "NA", "K", "CL", "CO2", "GLUCOSE", "BUN", "CREATININE", "CALCIUM", "PROT", "ALBUMIN", "AST", "ALT", "ALKPHOS", "BILITOT", "GFRNONAA", "GFRAA"     Component Value Date/Time   WBC 5.7 10/26/2015 1015   RBC 5.46 10/26/2015 1015   HGB 17.4 10/26/2015 1015   HCT 49.3 10/26/2015 1015   MCV 90.1 10/26/2015 1015   MCH 31.9 (A) 10/26/2015 1015   MCHC 35.4 10/26/2015 1015    No results found for: "POCLITH", "LITHIUM"   No results found for: "PHENYTOIN", "PHENOBARB", "VALPROATE", "CBMZ"   .res Assessment: Plan:    Greater than 50% of  30  min. video visit was spent on counseling and coordination of care.  Discussed his recent decline and stopping Viibryd. Talked about his concerns with the ineffectiveness of Viibryd. He is requesting trial of Trintellix.   We talked about his moods and frequent irritability, along with life stressors.    We agreed to:  Will start Trintellix 10 mg daily in the am Stopped Viibryd to 40 mg daily.  Will report any side effects or worsening symptoms of depression promptly. To follow up in 4 weeks to reassess.  Provided emergency  contact information Reviewed PDMP   Arlys John A. Tobey Lippard, NP    Burl was seen today for depression, anxiety, follow-up, medication refill, patient education and medication problem.  Diagnoses and all orders for this visit:  Major depressive disorder, recurrent episode, moderate (HCC) -     vortioxetine HBr (TRINTELLIX) 10 MG TABS tablet; Take 1 tablet (10 mg total) by mouth daily.  Generalized anxiety disorder -     vortioxetine HBr (TRINTELLIX) 10 MG TABS tablet; Take 1 tablet (10 mg total) by mouth daily.  Unspecified mood (affective) disorder (HCC) -     vortioxetine HBr (TRINTELLIX) 10 MG TABS tablet; Take 1 tablet (10 mg total) by mouth daily.     Please  see After Visit Summary for patient specific instructions.  No future appointments.  No orders of the defined types were placed in this encounter.     -------------------------------

## 2023-07-07 NOTE — Telephone Encounter (Signed)
 Pt LVM @ 4:20p said he had appt today with Arlys John.  He was prescribed Trintellix, but he went to the pharmacy and it needs a PA.

## 2023-07-08 NOTE — Telephone Encounter (Signed)
 Sent MyChart message about PA approval.

## 2023-07-08 NOTE — Telephone Encounter (Signed)
 Approval received effective through 07/07/24, [id number] for Trintellix 10 mg, pt should have received a co-pay savings card to use as well.

## 2023-07-08 NOTE — Telephone Encounter (Signed)
 PA submitted with [employer], pending response. This is a new start.

## 2023-08-04 ENCOUNTER — Encounter: Payer: Self-pay | Admitting: Behavioral Health

## 2023-08-04 ENCOUNTER — Telehealth: Admitting: Behavioral Health

## 2023-08-04 DIAGNOSIS — F39 Unspecified mood [affective] disorder: Secondary | ICD-10-CM

## 2023-08-04 DIAGNOSIS — F411 Generalized anxiety disorder: Secondary | ICD-10-CM

## 2023-08-04 DIAGNOSIS — F331 Major depressive disorder, recurrent, moderate: Secondary | ICD-10-CM

## 2023-08-04 MED ORDER — VORTIOXETINE HBR 20 MG PO TABS
20.0000 mg | ORAL_TABLET | Freq: Every day | ORAL | 1 refills | Status: DC
Start: 2023-08-04 — End: 2023-11-20

## 2023-08-04 NOTE — Progress Notes (Signed)
 Ivan Ortiz [id number] 1986/04/15 38 y.o.  Virtual Visit via Video Note  I connected with pt @ on 08/04/23 at 11:00 AM EDT by a video enabled telemedicine application and verified that I am speaking with the correct person using two identifiers.   I discussed the limitations of evaluation and management by telemedicine and the availability of in person appointments. The patient expressed understanding and agreed to proceed.  I discussed the assessment and treatment plan with the patient. The patient was provided an opportunity to ask questions and all were answered. The patient agreed with the plan and demonstrated an understanding of the instructions.   The patient was advised to call back or seek an in-person evaluation if the symptoms worsen or if the condition fails to improve as anticipated.  I provided 20 minutes of non-face-to-face time during this encounter.  The patient was located at home.  The provider was located at [facility].   Lincoln Renshaw, NP   Subjective:   Patient ID:  Ivan Ortiz is a 38 y.o. (DOB [birth year]) male.  Chief Complaint: No chief complaint on file.   HPI 38 year old male presents to this office via video visit for follow up and medication management.  He has now been taking Trintellix for over 4 weeks, has not noticed any difference at all. Requesting increase.   Was experiencing delayed ejaculation with Viibryd.  He is now in psychotherapy. Reports no change with anxiety at 3/10 and depression at 5/10. Sleeping 7-8 hours per night. Denies mania, no psychosis, no auditory or visual hallucinations. No SI or HI.      Past psychiatric medication trials Lexapro Wellbutrin Lamictal      Review of Systems:  Review of Systems  Constitutional: Negative.   Allergic/Immunologic: Negative.   Psychiatric/Behavioral:  Positive for dysphoric mood. The patient is nervous/anxious.     Medications: I have reviewed the patient's current  medications.  Current Outpatient Medications  Medication Sig Dispense Refill   vortioxetine HBr (TRINTELLIX) 20 MG TABS tablet Take 1 tablet (20 mg total) by mouth daily. 30 tablet 1   Vilazodone HCl (VIIBRYD) 40 MG TABS TAKE 1 TABLET BY MOUTH EVERY DAY 30 tablet 1   vortioxetine HBr (TRINTELLIX) 10 MG TABS tablet Take 1 tablet (10 mg total) by mouth daily. 30 tablet 1   No current facility-administered medications for this visit.    Medication Side Effects: None  Allergies: No Known Allergies  Past Medical History:  Diagnosis Date   RBBB     No family history on file.  Social History   Socioeconomic History   Marital status: Married    Spouse name: Cody Das   Number of children: 4   Years of education: 16   Highest education level: Bachelor's degree (e.g., BA, AB, BS)  Occupational History   Occupation: Patent examiner  Tobacco Use   Smoking status: Every Day    Current packs/day: 0.50    Types: Cigarettes   Smokeless tobacco: Never  Substance and Sexual Activity   Alcohol use: Yes    Comment: occ   Drug use: Not on file   Sexual activity: Yes    Comment: Wife  Other Topics Concern   Not on file  Social History Narrative   Lives with wife and 4 children in [place].   Social Drivers of Corporate investment banker Strain: Not on file  Food Insecurity: Not on file  Transportation Needs: Not on file  Physical  Activity: Not on file  Stress: Not on file  Social Connections: Not on file  Intimate Partner Violence: Not on file    Past Medical History, Surgical history, Social history, and Family history were reviewed and updated as appropriate.   Please see review of systems for further details on the patient's review from today.   Objective:   Physical Exam:  There were no vitals taken for this visit.  Physical Exam Constitutional:      General: He is not in acute distress.    Appearance: Normal appearance.  Neurological:     Mental Status: He  is alert and oriented to person, place, and time.     Gait: Gait normal.  Psychiatric:        Attention and Perception: Attention and perception normal. He does not perceive auditory or visual hallucinations.        Mood and Affect: Mood and affect normal. Mood is not anxious or depressed. Affect is not labile.        Speech: Speech normal.        Behavior: Behavior normal. Behavior is cooperative.        Thought Content: Thought content normal.        Cognition and Memory: Cognition and memory normal.        Judgment: Judgment normal.     Lab Review:  No results found for: "NA", "K", "CL", "CO2", "GLUCOSE", "BUN", "CREATININE", "CALCIUM", "PROT", "ALBUMIN", "AST", "ALT", "ALKPHOS", "BILITOT", "GFRNONAA", "GFRAA"     Component Value Date/Time   WBC 5.7 10/26/2015 1015   RBC 5.46 10/26/2015 1015   HGB 17.4 10/26/2015 1015   HCT 49.3 10/26/2015 1015   MCV 90.1 10/26/2015 1015   MCH 31.9 (A) 10/26/2015 1015   MCHC 35.4 10/26/2015 1015    No results found for: "POCLITH", "LITHIUM"   No results found for: "PHENYTOIN", "PHENOBARB", "VALPROATE", "CBMZ"   .res Assessment: Plan:    Greater than 50% of  30  min. video visit was spent on counseling and coordination of care. Reports no change or improvement so far. No sexual side effect. Discussed increasing his Trintellix but will  wait full 6 weeks to allow time for medication to work.   We talked about his moods and frequent irritability, along with life stressors.    We agreed to:   To increase Trintellix to 20 mg daily in the am Stopped Viibryd to 40 mg daily.  Will report any side effects or worsening symptoms of depression promptly. To follow up in 6 weeks to reassess.  Provided emergency contact information Reviewed PDMP   Arlys John A. Bradyn Soward, NP       Diagnoses and all orders for this visit:  Major depressive disorder, recurrent episode, moderate (HCC) -     vortioxetine HBr (TRINTELLIX) 20 MG TABS tablet; Take 1 tablet  (20 mg total) by mouth daily.  Generalized anxiety disorder -     vortioxetine HBr (TRINTELLIX) 20 MG TABS tablet; Take 1 tablet (20 mg total) by mouth daily.  Unspecified mood (affective) disorder (HCC) -     vortioxetine HBr (TRINTELLIX) 20 MG TABS tablet; Take 1 tablet (20 mg total) by mouth daily.     Please see After Visit Summary for patient specific instructions.  No future appointments.  No orders of the defined types were placed in this encounter.     -------------------------------

## 2023-11-18 ENCOUNTER — Other Ambulatory Visit: Payer: Self-pay | Admitting: Behavioral Health

## 2023-11-18 DIAGNOSIS — F39 Unspecified mood [affective] disorder: Secondary | ICD-10-CM

## 2023-11-18 DIAGNOSIS — F411 Generalized anxiety disorder: Secondary | ICD-10-CM

## 2023-11-18 DIAGNOSIS — F331 Major depressive disorder, recurrent, moderate: Secondary | ICD-10-CM

## 2024-01-29 ENCOUNTER — Other Ambulatory Visit: Payer: Self-pay

## 2024-01-29 ENCOUNTER — Encounter (HOSPITAL_BASED_OUTPATIENT_CLINIC_OR_DEPARTMENT_OTHER): Payer: Self-pay | Admitting: Orthopaedic Surgery

## 2024-02-01 NOTE — Discharge Instructions (Signed)
 Lillia Mountain, MD [facility]  Please read the following information regarding your care after surgery.  Medications  You only need a prescription for the narcotic pain medicine (ex. oxycodone , Percocet, Norco).  All of the other medicines listed below are available over the counter. ? acetaminophen  (Tylenol ) 500 mg every 4-6 hours as you need for minor to moderate pain  ? To help prevent blood clots, take aspirin (81 mg) twice daily for at least 42 days after surgery.  You should also get up every hour while you are awake to move around.  Weight Bearing ? Do NOT bear any weight on the operated leg or foot. This means do NOT touch your surgical leg to the ground!  Cast / Splint / Dressing ? If you have a splint, do NOT remove this. Keep your splint, cast or dressing clean and dry.  Don't put anything (coat hanger, pencil, etc) down inside of it.  If it gets wet, call the office immediately to schedule an appointment for a cast change.  Swelling IMPORTANT: It is normal for you to have swelling where you had surgery. To reduce swelling and pain, keep at least 3 pillows under your leg so that your toes are above your nose and your heel is above the level of your hip.  It may be necessary to keep your foot or leg elevated for several weeks.  This is critical to helping your incisions heal and your pain to feel better.  Follow Up Call my office at [phone] when you are discharged from the hospital or surgery center to schedule an appointment to be seen 7-10 days after surgery.  Call my office at [phone] if you develop a fever >101.5 F, nausea, vomiting, bleeding from the surgical site or severe pain.    No Tylenol  until after 8:30pm today, if needed.  No Ibuprofen  until after 9:40pm today, if needed.   Post Anesthesia Home Care Instructions  Activity: Get plenty of rest for the remainder of the day. A responsible individual must stay with you for 24 hours following the  procedure.  For the next 24 hours, DO NOT: -Drive a car -Advertising copywriter -Drink alcoholic beverages -Take any medication unless instructed by your physician -Make any legal decisions or sign important papers.  Meals: Start with liquid foods such as gelatin or soup. Progress to regular foods as tolerated. Avoid greasy, spicy, heavy foods. If nausea and/or vomiting occur, drink only clear liquids until the nausea and/or vomiting subsides. Call your physician if vomiting continues.  Special Instructions/Symptoms: Your throat may feel dry or sore from the anesthesia or the breathing tube placed in your throat during surgery. If this causes discomfort, gargle with warm salt water. The discomfort should disappear within 24 hours.  If you had a scopolamine patch placed behind your ear for the management of post- operative nausea and/or vomiting:  1. The medication in the patch is effective for 72 hours, after which it should be removed.  Wrap patch in a tissue and discard in the trash. Wash hands thoroughly with soap and water. 2. You may remove the patch earlier than 72 hours if you experience unpleasant side effects which may include dry mouth, dizziness or visual disturbances. 3. Avoid touching the patch. Wash your hands with soap and water after contact with the patch.   Regional Anesthesia Blocks  1. You may not be able to move or feel the blocked extremity after a regional anesthetic block. This may last may last from 3-48  hours after placement, but it will go away. The length of time depends on the medication injected and your individual response to the medication. As the nerves start to wake up, you may experience tingling as the movement and feeling returns to your extremity. If the numbness and inability to move your extremity has not gone away after 48 hours, please call your surgeon.   2. The extremity that is blocked will need to be protected until the numbness is gone and the  strength has returned. Because you cannot feel it, you will need to take extra care to avoid injury. Because it may be weak, you may have difficulty moving it or using it. You may not know what position it is in without looking at it while the block is in effect.  3. For blocks in the legs and feet, returning to weight bearing and walking needs to be done carefully. You will need to wait until the numbness is entirely gone and the strength has returned. You should be able to move your leg and foot normally before you try and bear weight or walk. You will need someone to be with you when you first try to ensure you do not fall and possibly risk injury.  4. Bruising and tenderness at the needle site are common side effects and will resolve in a few days.  5. Persistent numbness or new problems with movement should be communicated to the surgeon or the [facility] [phone] [facility] [phone]).

## 2024-02-01 NOTE — H&P (Signed)
 ORTHOPAEDIC SURGERY H&P  Subjective:  The patient presents for left ankle fx.   Past Medical History:  Diagnosis Date   ADHD (attention deficit hyperactivity disorder)    Anxiety    Depression    RBBB     Past Surgical History:  Procedure Laterality Date   APPENDECTOMY       (Not in an outpatient encounter)    No Known Allergies  Social History   Socioeconomic History   Marital status: Married    Spouse name: Brandi   Number of children: 4   Years of education: 16   Highest education level: Bachelor's degree (e.g., BA, AB, BS)  Occupational History   Occupation: Patent examiner  Tobacco Use   Smoking status: Former    Current packs/day: 0.50    Types: Cigarettes   Smokeless tobacco: Never  Vaping Use   Vaping status: Every Day   Substances: Nicotine  Substance and Sexual Activity   Alcohol use: Yes    Comment: occ   Drug use: Never   Sexual activity: Yes    Comment: Wife  Other Topics Concern   Not on file  Social History Narrative   Lives with wife and 4 children in [place].   Social Drivers of Corporate investment banker Strain: Not on file  Food Insecurity: Not on file  Transportation Needs: Not on file  Physical Activity: Not on file  Stress: Not on file  Social Connections: Not on file  Intimate Partner Violence: Not on file     History reviewed. No pertinent family history.   Review of Systems Pertinent items are noted in HPI.  Objective: Vital signs in last 24 hours:    01/29/2024    2:21 PM 07/16/2021    8:30 PM 07/16/2021    7:45 PM  Vitals with BMI  Height 5' 8    Weight 195 lbs    BMI 29.66    Systolic  109 116  Diastolic  75 74  Pulse  59 61      EXAM: General: Well nourished, well developed. Awake, alert and oriented to time, place, person. Normal mood and affect. No apparent distress. Breathing room air.  Operative Lower Extremity: Alignment - Neutral Deformity - None Skin intact Tenderness to  palpation - left ankle 5/5 TA, PT, GS, Per, EHL, FHL Sensation intact to light touch throughout Palpable DP and PT pulses Special testing: None  The contralateral foot/ankle was examined for comparison and noted to be neurovascularly intact with no localized deformity, swelling, or tenderness.  Imaging Review All images taken were independently reviewed by me.  Assessment/Plan: The clinical and radiographic findings were reviewed and discussed at length with the patient.  The patient presents for left ankle fx.  We spoke at length about the natural course of these findings. We discussed nonoperative and operative treatment options in detail.  The risks and benefits were presented and reviewed. The risks due to suture/hardware failure/irritation (or if removing hardware inability to remove part/all of hardware, recurrent instability), new/persistent/recurrent infection, stiffness, nerve/vessel/tendon injury, nonunion/malunion of any fracture, wound healing issues, allograft usage, development of arthritis, failure of this surgery, possibility of external fixation in certain situations, possibility of delayed definitive surgery, need for further surgery, prolonged wound care including further soft tissue coverage procedures, thromboembolic events, anesthesia/medical complications/events perioperatively and beyond, amputation, death among others were discussed. The patient acknowledged the explanation and agreed to proceed with the plan.  Lillia Mountain  Orthopaedic Surgery [facility]

## 2024-02-03 ENCOUNTER — Other Ambulatory Visit: Payer: Self-pay

## 2024-02-03 ENCOUNTER — Encounter (HOSPITAL_BASED_OUTPATIENT_CLINIC_OR_DEPARTMENT_OTHER)
Admission: RE | Disposition: A | Discharge status: Home / Self Care | Payer: Self-pay | Source: Home / Self Care | Attending: Orthopaedic Surgery

## 2024-02-03 ENCOUNTER — Ambulatory Visit (HOSPITAL_BASED_OUTPATIENT_CLINIC_OR_DEPARTMENT_OTHER)

## 2024-02-03 ENCOUNTER — Encounter (HOSPITAL_BASED_OUTPATIENT_CLINIC_OR_DEPARTMENT_OTHER): Payer: Self-pay | Admitting: Anesthesiology

## 2024-02-03 ENCOUNTER — Ambulatory Visit (HOSPITAL_BASED_OUTPATIENT_CLINIC_OR_DEPARTMENT_OTHER): Payer: Self-pay | Admitting: Anesthesiology

## 2024-02-03 ENCOUNTER — Encounter (HOSPITAL_BASED_OUTPATIENT_CLINIC_OR_DEPARTMENT_OTHER): Payer: Self-pay | Admitting: Orthopaedic Surgery

## 2024-02-03 ENCOUNTER — Ambulatory Visit (HOSPITAL_BASED_OUTPATIENT_CLINIC_OR_DEPARTMENT_OTHER)
Admission: RE | Admit: 2024-02-03 | Discharge: 2024-02-03 | Disposition: A | Discharge status: Home / Self Care | Attending: Orthopaedic Surgery | Admitting: Orthopaedic Surgery

## 2024-02-03 DIAGNOSIS — X58XXXA Exposure to other specified factors, initial encounter: Secondary | ICD-10-CM | POA: Insufficient documentation

## 2024-02-03 DIAGNOSIS — F17290 Nicotine dependence, other tobacco product, uncomplicated: Secondary | ICD-10-CM | POA: Insufficient documentation

## 2024-02-03 DIAGNOSIS — Z6830 Body mass index (BMI) 30.0-30.9, adult: Secondary | ICD-10-CM | POA: Insufficient documentation

## 2024-02-03 DIAGNOSIS — E669 Obesity, unspecified: Secondary | ICD-10-CM | POA: Diagnosis not present

## 2024-02-03 DIAGNOSIS — S82832A Other fracture of upper and lower end of left fibula, initial encounter for closed fracture: Secondary | ICD-10-CM

## 2024-02-03 HISTORY — DX: Anxiety disorder, unspecified: F41.9

## 2024-02-03 HISTORY — PX: LIGAMENT REPAIR: SHX5444

## 2024-02-03 HISTORY — PX: ORIF ANKLE FRACTURE: SHX5408

## 2024-02-03 HISTORY — DX: Attention-deficit hyperactivity disorder, unspecified type: F90.9

## 2024-02-03 HISTORY — DX: Depression, unspecified: F32.A

## 2024-02-03 SURGERY — OPEN REDUCTION INTERNAL FIXATION (ORIF) ANKLE FRACTURE
Anesthesia: General | Site: Ankle | Laterality: Left

## 2024-02-03 MED ORDER — FENTANYL CITRATE (PF) 100 MCG/2ML IJ SOLN
INTRAMUSCULAR | Status: AC
  Filled 2024-02-03: qty 2

## 2024-02-03 MED ORDER — ACETAMINOPHEN 500 MG PO TABS
ORAL_TABLET | ORAL | Status: AC
  Filled 2024-02-03: qty 2

## 2024-02-03 MED ORDER — KETOROLAC TROMETHAMINE 30 MG/ML IJ SOLN
INTRAMUSCULAR | Status: AC
  Filled 2024-02-03: qty 1

## 2024-02-03 MED ORDER — OXYCODONE HCL 5 MG PO TABS: 5.0000 mg | ORAL_TABLET | Freq: Once | ORAL | Status: DC | PRN

## 2024-02-03 MED ORDER — CEFAZOLIN SODIUM-DEXTROSE 2-4 GM/100ML-% IV SOLN
INTRAVENOUS | Status: AC
  Filled 2024-02-03: qty 100

## 2024-02-03 MED ORDER — MIDAZOLAM HCL 2 MG/2ML IJ SOLN
INTRAMUSCULAR | Status: AC
  Filled 2024-02-03: qty 2

## 2024-02-03 MED ORDER — LIDOCAINE 2% (20 MG/ML) 5 ML SYRINGE
INTRAMUSCULAR | Status: AC
Start: 2024-02-03 — End: 2024-02-03
  Filled 2024-02-03: qty 5

## 2024-02-03 MED ORDER — FENTANYL CITRATE (PF) 100 MCG/2ML IJ SOLN
100.0000 ug | Freq: Once | INTRAMUSCULAR | Status: AC
  Administered 2024-02-03: 100 ug via INTRAVENOUS

## 2024-02-03 MED ORDER — GLYCOPYRROLATE PF 0.2 MG/ML IJ SOSY
PREFILLED_SYRINGE | INTRAMUSCULAR | Status: AC
  Filled 2024-02-03: qty 1

## 2024-02-03 MED ORDER — EPHEDRINE SULFATE (PRESSORS) 50 MG/ML IJ SOLN
INTRAMUSCULAR | Status: DC | PRN
  Administered 2024-02-03 (×2): 10 mg via INTRAVENOUS

## 2024-02-03 MED ORDER — FENTANYL CITRATE (PF) 100 MCG/2ML IJ SOLN: 25.0000 ug | INTRAMUSCULAR | Status: DC | PRN

## 2024-02-03 MED ORDER — MIDAZOLAM HCL 5 MG/5ML IJ SOLN
INTRAMUSCULAR | Status: DC | PRN
  Administered 2024-02-03 (×2): 1 mg via INTRAVENOUS

## 2024-02-03 MED ORDER — FENTANYL CITRATE (PF) 100 MCG/2ML IJ SOLN
INTRAMUSCULAR | Status: DC | PRN
  Administered 2024-02-03 (×2): 25 ug via INTRAVENOUS
  Administered 2024-02-03: 50 ug via INTRAVENOUS

## 2024-02-03 MED ORDER — KETOROLAC TROMETHAMINE 30 MG/ML IJ SOLN
INTRAMUSCULAR | Status: DC | PRN
  Administered 2024-02-03: 30 mg via INTRAVENOUS

## 2024-02-03 MED ORDER — LACTATED RINGERS IV SOLN: INTRAVENOUS | Status: DC | PRN

## 2024-02-03 MED ORDER — CEFAZOLIN SODIUM-DEXTROSE 2-4 GM/100ML-% IV SOLN
2.0000 g | INTRAVENOUS | Status: AC
  Administered 2024-02-03: 2 g via INTRAVENOUS

## 2024-02-03 MED ORDER — CHLORHEXIDINE GLUCONATE 4 % EX SOLN: 60.0000 mL | Freq: Once | CUTANEOUS | Status: DC

## 2024-02-03 MED ORDER — ONDANSETRON HCL 4 MG/2ML IJ SOLN
INTRAMUSCULAR | Status: AC
  Filled 2024-02-03: qty 2

## 2024-02-03 MED ORDER — DEXMEDETOMIDINE HCL IN NACL 80 MCG/20ML IV SOLN
INTRAVENOUS | Status: DC | PRN
  Administered 2024-02-03 (×2): 8 ug via INTRAVENOUS
  Administered 2024-02-03: 4 ug via INTRAVENOUS

## 2024-02-03 MED ORDER — 0.9 % SODIUM CHLORIDE (POUR BTL) OPTIME
TOPICAL | Status: DC | PRN
  Administered 2024-02-03: 1000 mL

## 2024-02-03 MED ORDER — ONDANSETRON HCL 4 MG/2ML IJ SOLN
INTRAMUSCULAR | Status: DC | PRN
  Administered 2024-02-03: 4 mg via INTRAVENOUS

## 2024-02-03 MED ORDER — PROPOFOL 10 MG/ML IV BOLUS
INTRAVENOUS | Status: DC | PRN
  Administered 2024-02-03: 200 mg via INTRAVENOUS

## 2024-02-03 MED ORDER — GLYCOPYRROLATE 0.2 MG/ML IJ SOLN
INTRAMUSCULAR | Status: DC | PRN
  Administered 2024-02-03: .2 mg via INTRAVENOUS

## 2024-02-03 MED ORDER — ACETAMINOPHEN 500 MG PO TABS
1000.0000 mg | ORAL_TABLET | Freq: Once | ORAL | Status: AC
  Administered 2024-02-03: 1000 mg via ORAL

## 2024-02-03 MED ORDER — SODIUM CHLORIDE 0.9 % IV SOLN: 12.5000 mg | INTRAVENOUS | Status: DC | PRN

## 2024-02-03 MED ORDER — AMISULPRIDE (ANTIEMETIC) 5 MG/2ML IV SOLN: 10.0000 mg | Freq: Once | INTRAVENOUS | Status: DC | PRN

## 2024-02-03 MED ORDER — OXYCODONE HCL 5 MG/5ML PO SOLN: 5.0000 mg | Freq: Once | ORAL | Status: DC | PRN

## 2024-02-03 MED ORDER — LIDOCAINE 2% (20 MG/ML) 5 ML SYRINGE
INTRAMUSCULAR | Status: DC | PRN
  Administered 2024-02-03: 40 mg via INTRAVENOUS

## 2024-02-03 MED ORDER — MIDAZOLAM HCL 2 MG/2ML IJ SOLN
2.0000 mg | Freq: Once | INTRAMUSCULAR | Status: AC
  Administered 2024-02-03: 2 mg via INTRAVENOUS

## 2024-02-03 MED ORDER — LACTATED RINGERS IV SOLN: INTRAVENOUS | Status: DC

## 2024-02-03 MED ORDER — PROPOFOL 500 MG/50ML IV EMUL
INTRAVENOUS | Status: DC | PRN
Start: 2024-02-03 — End: 2024-02-03
  Administered 2024-02-03: 125 ug/kg/min via INTRAVENOUS

## 2024-02-03 MED ORDER — DEXAMETHASONE SOD PHOSPHATE PF 10 MG/ML IJ SOLN
INTRAMUSCULAR | Status: DC | PRN
Start: 2024-02-03 — End: 2024-02-03
  Administered 2024-02-03: 8 mg via INTRAVENOUS

## 2024-02-03 SURGICAL SUPPLY — 53 items
BIT DRILL 2.5X2.75 QC CALB (BIT) IMPLANT
BLADE SURG 15 STRL LF DISP TIS (BLADE) ×8 IMPLANT
BNDG COHESIVE 4X5 TAN STRL LF (GAUZE/BANDAGES/DRESSINGS) IMPLANT
BNDG ELASTIC 6X10 VLCR STRL LF (GAUZE/BANDAGES/DRESSINGS) ×2 IMPLANT
BNDG STRETCH GAUZE 3IN X12FT (GAUZE/BANDAGES/DRESSINGS) IMPLANT
CANISTER SUCT 1200ML W/VALVE (MISCELLANEOUS) IMPLANT
CHLORAPREP W/TINT 26 (MISCELLANEOUS) ×2 IMPLANT
COVER BACK TABLE 60X90IN (DRAPES) ×2 IMPLANT
CUFF TRNQT CYL 34X4.125X (TOURNIQUET CUFF) ×2 IMPLANT
DRAPE C-ARM 42X72 X-RAY (DRAPES) ×2 IMPLANT
DRAPE C-ARMOR (DRAPES) ×2 IMPLANT
DRAPE EXTREMITY T 121X128X90 (DISPOSABLE) ×2 IMPLANT
DRAPE IMP U-DRAPE 54X76 (DRAPES) ×2 IMPLANT
DRAPE OEC MINIVIEW 54X84 (DRAPES) IMPLANT
DRAPE U-SHAPE 47X51 STRL (DRAPES) ×2 IMPLANT
DRIVER RETENTION T15 LONG (ORTHOPEDIC DISPOSABLE SUPPLIES) IMPLANT
DRSG MEPITEL 4X7.2 (GAUZE/BANDAGES/DRESSINGS) ×2 IMPLANT
ELECTRODE REM PT RTRN 9FT ADLT (ELECTROSURGICAL) ×2 IMPLANT
FIXATION ZIPTIGHT ANKLE SNDSMS (Ankle) IMPLANT
GAUZE PAD ABD 8X10 STRL (GAUZE/BANDAGES/DRESSINGS) ×2 IMPLANT
GAUZE SPONGE 4X4 12PLY STRL (GAUZE/BANDAGES/DRESSINGS) ×2 IMPLANT
GLOVE BIOGEL PI IND STRL 8 (GLOVE) ×2 IMPLANT
GLOVE SURG SS PI 7.5 STRL IVOR (GLOVE) ×4 IMPLANT
GOWN STRL REUS W/ TWL LRG LVL3 (GOWN DISPOSABLE) ×4 IMPLANT
KWIRE ALPS MXV 1.6X6 ZI (WIRE) IMPLANT
MARKER SKIN DUAL TIP RULER LAB (MISCELLANEOUS) IMPLANT
NDL HYPO 25X1 1.5 SAFETY (NEEDLE) IMPLANT
NEEDLE HYPO 25X1 1.5 SAFETY (NEEDLE) IMPLANT
NS IRRIG 1000ML POUR BTL (IV SOLUTION) ×2 IMPLANT
PACK BASIN DAY SURGERY FS (CUSTOM PROCEDURE TRAY) ×2 IMPLANT
PADDING CAST SYNTHETIC 4X4 STR (CAST SUPPLIES) ×6 IMPLANT
PADDING CAST SYNTHETIC 6X4 NS (CAST SUPPLIES) ×4 IMPLANT
PENCIL SMOKE EVACUATOR (MISCELLANEOUS) ×2 IMPLANT
PLATE 1/3 TUBULAR 6H (Plate) IMPLANT
SCREW LOCK MDS 3.5X12 (Screw) IMPLANT
SCREW NLOCK ALPS 3.5X14 (Screw) IMPLANT
SHEET MEDIUM DRAPE 40X70 STRL (DRAPES) ×2 IMPLANT
SLEEVE SCD COMPRESS KNEE MED (STOCKING) ×2 IMPLANT
SPIKE FLUID TRANSFER (MISCELLANEOUS) IMPLANT
SPLINT FIBERGLASS 4X30 (CAST SUPPLIES) ×4 IMPLANT
SPONGE T-LAP 18X18 ~~LOC~~+RFID (SPONGE) ×2 IMPLANT
STAPLER SKIN PROX WIDE 3.9 (STAPLE) ×2 IMPLANT
SUCTION TUBE FRAZIER 10FR DISP (SUCTIONS) IMPLANT
SUT ETHILON 2 0 FS 18 (SUTURE) ×4 IMPLANT
SUT MNCRL AB 3-0 PS2 18 (SUTURE) IMPLANT
SUT PDS II 3-0 CT2 27 ABS (SUTURE) IMPLANT
SUT VIC AB 0 CT1 27XBRD ANBCTR (SUTURE) IMPLANT
SUT VIC AB 2-0 CT1 TAPERPNT 27 (SUTURE) ×2 IMPLANT
SYR BULB IRRIG 60ML STRL (SYRINGE) ×2 IMPLANT
SYR CONTROL 10ML LL (SYRINGE) IMPLANT
TOWEL GREEN STERILE FF (TOWEL DISPOSABLE) ×4 IMPLANT
TUBE CONNECTING 20X1/4 (TUBING) IMPLANT
UNDERPAD 30X36 HEAVY ABSORB (UNDERPADS AND DIAPERS) ×2 IMPLANT

## 2024-02-03 NOTE — H&P (Signed)

## 2024-02-03 NOTE — Anesthesia Postprocedure Evaluation (Signed)
 Anesthesia Post Note  Patient: Ivan Ortiz  Procedure(s) Performed: OPEN REDUCTION INTERNAL FIXATION (ORIF) ANKLE FRACTURE (Left: Ankle) REPAIR, LIGAMENT (Left)     Patient location during evaluation: PACU Anesthesia Type: General Level of consciousness: awake and alert Pain management: pain level controlled Vital Signs Assessment: post-procedure vital signs reviewed and stable Respiratory status: spontaneous breathing, nonlabored ventilation and respiratory function stable Cardiovascular status: stable and blood pressure returned to baseline Anesthetic complications: no   No notable events documented.  Last Vitals:  Vitals:   02/03/24 1425 02/03/24 1430  BP:  103/63  Pulse: 69 84  Resp: 13 17  Temp:    SpO2: 94% 94%    Last Pain:  Vitals:   02/03/24 1430  PainSc: 0-No pain                 Debby FORBES Like

## 2024-02-03 NOTE — Transfer of Care (Signed)
 Immediate Anesthesia Transfer of Care Note  Patient: Ivan Ortiz  Procedure(s) Performed: OPEN REDUCTION INTERNAL FIXATION (ORIF) ANKLE FRACTURE (Left: Ankle) REPAIR, LIGAMENT (Left)  Patient Location: PACU  Anesthesia Type:GA combined with regional for post-op pain  Level of Consciousness: drowsy and responds to stimulation  Airway & Oxygen Therapy: Patient Spontanous Breathing and Patient connected to face mask oxygen  Post-op Assessment: Report given to RN and Post -op Vital signs reviewed and stable  Post vital signs: Reviewed and stable  Last Vitals:  Vitals Value Taken Time  BP 97/52 02/03/24 14:01  Temp 37.2 C 02/03/24 14:01  Pulse 63 02/03/24 14:06  Resp 13 02/03/24 14:06  SpO2 96 % 02/03/24 14:06  Vitals shown include unfiled device data.  Last Pain:  Vitals:   02/03/24 1135  PainSc: 0-No pain         Complications: No notable events documented.

## 2024-02-03 NOTE — Progress Notes (Signed)
 Assisted Dr. Mal Amabile with right, popliteal, ultrasound guided block. Side rails up, monitors on throughout procedure. See vital signs in flow sheet. Tolerated Procedure well.

## 2024-02-03 NOTE — Op Note (Addendum)
 02/03/2024  2:02 PM   PATIENT: Ivan Ortiz  38 y.o. male  MRN: [id number]   PRE-OPERATIVE DIAGNOSIS:   Left distal fibula fracture, initial encounter for closed fracture   POST-OPERATIVE DIAGNOSIS:   Same   PROCEDURE: 1] Left distal fibula ORIF 2] Left ankle syndesmosis ORIF   SURGEON:  Lillia Mountain, MD   ASSISTANT: None   ANESTHESIA: General, regional   EBL: Minimal   TOURNIQUET:    Total Tourniquet Time Documented: Thigh (Left) - 37 minutes Total: Thigh (Left) - 37 minutes    COMPLICATIONS: None apparent   DISPOSITION: Extubated, awake and stable to recovery.   INDICATION FOR PROCEDURE: The patient presented with above diagnosis.  We discussed the diagnosis, alternative treatment options, risks and benefits of the above surgical intervention, as well as alternative non-operative treatments. All questions/concerns were addressed and the patient/family demonstrated appropriate understanding of the diagnosis, the procedure, the postoperative course, and overall prognosis. The patient wished to proceed with surgical intervention and signed an informed surgical consent as such, in each others presence prior to surgery.   PROCEDURE IN DETAIL: After preoperative consent was obtained and the correct operative site was identified, the patient was brought to the operating room supine on stretcher. General anesthesia was induced. Preoperative antibiotics were administered. Surgical timeout was taken. The patient was then positioned supine with an ipsilateral hip bump. The operative lower extremity was prepped and draped in standard sterile fashion with a tourniquet around the thigh. The extremity was exsanguinated and the tourniquet was inflated to 275 mmHg.  A standard lateral incision was made over the distal fibula. Dissection was carried down to the level of the fibula and the fracture site identified. The superficial peroneal nerve was identified and  protected throughout the procedure. The fibula was noted to be shortened with interposed periosteum. The fibula was brought out to length. The fibula fracture was debrided and the edges defined to achieve cortical read. Reduction maneuver was performed using pointed reduction forceps and lobster forceps. In this manner, the fibula length was restored and fracture reduced. A lag screw was not placed given the orientation of fracture lines and comminution. Due to poor bone quality and extensive comminution at the fracture site, it was decided to use a locking distal fibula plate. We then selected a Zimmer locking plate to match the anatomy of the distal fibula and placed it laterally. This was implanted under intraoperative fluoroscopy with a combination of distal locking screws and proximal cortical & locking screws.  A manual external rotation stress radiograph was obtained and demonstrated widening of the ankle mortise. Given this intraoperative finding as well as preoperative subluxation, it was decided to reduce and fix the syndesmosis. Therefore a suture fixation system (ZipTight device) was implanted through the fibula plate in cannulated fashion to fix the syndesmosis. Anchor/button position was verified along anteromedial tibial cortex by fluoroscopy. A repeat stress radiograph showed complete stability of the ankle mortise to testing.  The surgical sites were thoroughly irrigated. The tourniquet was deflated and hemostasis achieved. The deep layers were closed using 2-0 vicryl. The skin was closed without tension.    The leg was cleaned with saline and sterile dressings with gauze were applied. A well padded bulky short leg splint was applied. The patient was awakened from anesthesia and transported to the recovery room in stable condition.    FOLLOW UP PLAN: -transfer to PACU, then home -strict NWB operative extremity, maximum elevation -maintain short leg splint until follow up -DVT  ppx:  Aspirin 81 mg twice daily while NWB -follow up as outpatient within 7-10 days for wound check with exchange of short leg splint to short leg cast -sutures out in 2-3 weeks in outpatient office   RADIOGRAPHS: AP, lateral, oblique and stress radiographs of the left ankle were obtained intraoperatively. These showed interval reduction and fixation of the fractures. Manual stress radiographs were taken and the joints were noted to be stable following fixation. All hardware is appropriately positioned and of the appropriate lengths. No other acute injuries are noted.   Lillia Mountain [facility]

## 2024-02-03 NOTE — Anesthesia Procedure Notes (Signed)
 Procedure Name: LMA Insertion Date/Time: 02/03/2024 1:00 PM  Performed by: Denton Niels CROME, CRNAPre-anesthesia Checklist: Patient identified, Emergency Drugs available, Suction available, Patient being monitored and Timeout performed Patient Re-evaluated:Patient Re-evaluated prior to induction Oxygen Delivery Method: Circle system utilized Preoxygenation: Pre-oxygenation with 100% oxygen Induction Type: IV induction Ventilation: Mask ventilation without difficulty LMA: LMA inserted LMA Size: 5.0 Number of attempts: 1 Placement Confirmation: positive ETCO2 Dental Injury: Teeth and Oropharynx as per pre-operative assessment

## 2024-02-03 NOTE — Addendum Note (Signed)
 Addendum  created 02/03/24 1612 by Denton Niels CROME, CRNA   Flowsheet accepted

## 2024-02-03 NOTE — Anesthesia Preprocedure Evaluation (Addendum)
 Anesthesia Evaluation  Patient identified by MRN, date of birth, ID band Patient awake    Reviewed: Allergy & Precautions, NPO status , Patient's Chart, lab work & pertinent test results  History of Anesthesia Complications Negative for: history of anesthetic complications  Airway Mallampati: II  TM Distance: >3 FB Neck ROM: Full    Dental  (+) Dental Advisory Given, Teeth Intact   Pulmonary former smoker   Pulmonary exam normal        Cardiovascular Normal cardiovascular exam   Hx RBBB    Neuro/Psych  PSYCHIATRIC DISORDERS Anxiety Depression    negative neurological ROS     GI/Hepatic negative GI ROS, Neg liver ROS,,,  Endo/Other   Obesity   Renal/GU negative Renal ROS     Musculoskeletal negative musculoskeletal ROS (+)    Abdominal   Peds  (+) ADHD Hematology negative hematology ROS (+)   Anesthesia Other Findings   Reproductive/Obstetrics                              Anesthesia Physical Anesthesia Plan  ASA: 2  Anesthesia Plan: General   Post-op Pain Management: Regional block* and Tylenol PO (pre-op)*   Induction: Intravenous  PONV Risk Score and Plan: 2 and Treatment may vary due to age or medical condition, Ondansetron, Dexamethasone and Midazolam  Airway Management Planned: LMA  Additional Equipment: None  Intra-op Plan:   Post-operative Plan: Extubation in OR  Informed Consent: I have reviewed the patients History and Physical, chart, labs and discussed the procedure including the risks, benefits and alternatives for the proposed anesthesia with the patient or authorized representative who has indicated his/her understanding and acceptance.     Dental advisory given  Plan Discussed with: CRNA and Anesthesiologist  Anesthesia Plan Comments:          Anesthesia Quick Evaluation

## 2024-02-04 ENCOUNTER — Encounter (HOSPITAL_BASED_OUTPATIENT_CLINIC_OR_DEPARTMENT_OTHER): Payer: Self-pay | Admitting: Orthopaedic Surgery
